# Patient Record
Sex: Male | Born: 1955 | Race: White | Hispanic: No | Marital: Married | Smoking: Never smoker
Health system: Southern US, Community
[De-identification: ages and names within clinical notes are randomized; demographics above are authoritative.]

## PROBLEM LIST (undated history)

## (undated) DIAGNOSIS — D759 Disease of blood and blood-forming organs, unspecified: Secondary | ICD-10-CM

## (undated) DIAGNOSIS — IMO0001 Reserved for inherently not codable concepts without codable children: Secondary | ICD-10-CM

## (undated) DIAGNOSIS — I739 Peripheral vascular disease, unspecified: Secondary | ICD-10-CM

## (undated) DIAGNOSIS — E785 Hyperlipidemia, unspecified: Secondary | ICD-10-CM

## (undated) DIAGNOSIS — Z8249 Family history of ischemic heart disease and other diseases of the circulatory system: Secondary | ICD-10-CM

## (undated) HISTORY — DX: Family history of ischemic heart disease and other diseases of the circulatory system: Z82.49

## (undated) HISTORY — DX: Peripheral vascular disease, unspecified: I73.9

## (undated) HISTORY — PX: COLONOSCOPY: SHX174

## (undated) HISTORY — DX: Hyperlipidemia, unspecified: E78.5

## (undated) HISTORY — DX: Reserved for inherently not codable concepts without codable children: IMO0001

---

## 1991-06-30 HISTORY — PX: HERNIA REPAIR: SHX51

## 2000-07-06 ENCOUNTER — Encounter: Payer: Self-pay | Admitting: Family Medicine

## 2000-07-06 ENCOUNTER — Encounter: Admission: RE | Admit: 2000-07-06 | Discharge: 2000-07-06 | Payer: Self-pay | Admitting: Family Medicine

## 2001-04-22 ENCOUNTER — Ambulatory Visit (HOSPITAL_COMMUNITY): Admission: RE | Admit: 2001-04-22 | Discharge: 2001-04-22 | Payer: Self-pay | Admitting: Gastroenterology

## 2001-04-22 ENCOUNTER — Encounter: Payer: Self-pay | Admitting: Gastroenterology

## 2003-02-19 ENCOUNTER — Encounter: Admission: RE | Admit: 2003-02-19 | Discharge: 2003-02-19 | Payer: Self-pay | Admitting: Family Medicine

## 2003-02-19 ENCOUNTER — Encounter: Payer: Self-pay | Admitting: Family Medicine

## 2004-06-29 HISTORY — PX: PIP JOINT FUSION: SHX2238

## 2005-03-04 ENCOUNTER — Ambulatory Visit (HOSPITAL_BASED_OUTPATIENT_CLINIC_OR_DEPARTMENT_OTHER): Admission: RE | Admit: 2005-03-04 | Discharge: 2005-03-05 | Payer: Self-pay | Admitting: *Deleted

## 2005-03-04 ENCOUNTER — Ambulatory Visit (HOSPITAL_COMMUNITY): Admission: RE | Admit: 2005-03-04 | Discharge: 2005-03-04 | Payer: Self-pay | Admitting: *Deleted

## 2006-06-29 HISTORY — PX: SHOULDER ARTHROSCOPY: SHX128

## 2006-06-29 HISTORY — PX: ORIF CALCANEOUS FRACTURE: SHX5030

## 2006-07-23 ENCOUNTER — Encounter: Admission: RE | Admit: 2006-07-23 | Discharge: 2006-07-23 | Payer: Self-pay | Admitting: Orthopaedic Surgery

## 2006-07-29 ENCOUNTER — Inpatient Hospital Stay (HOSPITAL_COMMUNITY): Admission: RE | Admit: 2006-07-29 | Discharge: 2006-07-30 | Payer: Self-pay | Admitting: Orthopedic Surgery

## 2006-10-20 ENCOUNTER — Encounter: Admission: RE | Admit: 2006-10-20 | Discharge: 2006-10-20 | Payer: Self-pay | Admitting: Orthopedic Surgery

## 2007-06-30 HISTORY — PX: FOOT ARTHRODESIS: SHX1655

## 2010-04-19 ENCOUNTER — Encounter: Admission: RE | Admit: 2010-04-19 | Discharge: 2010-04-19 | Payer: Self-pay | Admitting: *Deleted

## 2010-07-20 ENCOUNTER — Encounter: Payer: Self-pay | Admitting: Orthopedic Surgery

## 2010-09-23 ENCOUNTER — Other Ambulatory Visit: Payer: Self-pay | Admitting: Orthopedic Surgery

## 2010-09-23 DIAGNOSIS — M542 Cervicalgia: Secondary | ICD-10-CM

## 2010-09-29 ENCOUNTER — Ambulatory Visit
Admission: RE | Admit: 2010-09-29 | Discharge: 2010-09-29 | Disposition: A | Discharge status: Home / Self Care | Payer: BC Managed Care – PPO | Source: Ambulatory Visit | Attending: Orthopedic Surgery | Admitting: Orthopedic Surgery

## 2010-09-29 ENCOUNTER — Ambulatory Visit
Admission: RE | Admit: 2010-09-29 | Discharge: 2010-09-29 | Disposition: A | Discharge status: Home / Self Care | Payer: Self-pay | Source: Ambulatory Visit | Attending: Orthopedic Surgery | Admitting: Orthopedic Surgery

## 2010-09-29 DIAGNOSIS — M542 Cervicalgia: Secondary | ICD-10-CM

## 2010-11-14 NOTE — Op Note (Signed)
NAME:  SHOWN, [name]           ACCOUNT NO.:  [id number]   MEDICAL RECORD NO.:  [id number]          PATIENT TYPE:  INP   LOCATION:  2852                         FACILITY:  [facility]   PHYSICIAN:  Nadara Mustard, MD     DATE OF BIRTH:  Jan 13, 1956   DATE OF PROCEDURE:  07/28/2006  DATE OF DISCHARGE:                               OPERATIVE REPORT   PREOPERATIVE DIAGNOSIS:  Tongue tight left calcaneal fracture, Kyle Hansen  two-part.   POSTOPERATIVE DIAGNOSIS:  Tongue tight left calcaneal fracture, Kyle Hansen  two-part.   PROCEDURE:  Open reduction and internal fixation left calcaneus.   SURGEON:  Nadara Mustard, MD   ASSISTANT:  Vanita Panda. Magnus Ivan, M.D.   ANESTHESIA:  General.   ESTIMATED BLOOD LOSS:  Minimal.   ANTIBIOTICS:  1 gram of Kefzol.   DRAINS:  None.   COMPLICATIONS:  None.   TOURNIQUET TIME:  None.   DISPOSITION:  To the PACU in stable condition.   INDICATIONS FOR PROCEDURE:  The patient is a 55 year old gentleman  status post a fall sustaining a Sanders two-part with a tongue type  fracture of the posterior facet.  The patient, due to widening and non-  congruence of the posterior facet position, presents at this time for  internal fixation.  Risks and benefits were discussed including  infection, neurovascular injury, nonhealing of the wounds, arthritis,  need for additional surgery.  The patient states he understands and  wishes to proceed at this time.   DESCRIPTION OF PROCEDURE:  The patient was brought to OR room 1 and  underwent general anesthetic.  After an adequate level of anesthesia was  obtained, the patient was placed in the right lateral decubitus position  with the left side up and the left lower extremity was prepped using  DuraPrep and draped into a sterile field.  A percutaneous Shantz pin  with a hand chuck was placed through the os calcis tuberosity.  Using  distraction and a valgus stress, the varus impaction was decompressed.  Attention was then focused on the posterior facet.  Two pins were then  placed from the posterior aspect into the tongue type fracture of the  posterior facet. The posterior facet was elevated until this was aligned  to its normal articular alignment.  Then, a 2.5 drill bit was used and  three screws were placed superior and posteriorly through the tongue  type fragment and placed extended down into the inferior aspect of the  calcaneus to stabilize the posterior facet.  C-arm fluoroscopy was used  to verified reduction in both AP and lateral planes.  The patient still  had a mild residual varus but had anatomic restoration of the posterior  facet.  The wounds were irrigated with normal saline. The wounds were covered  Adaptic orthopedic sponges, Webril and a Coban dressing.  The patient  was extubated and taken to the PACU in stable condition.  The plan is  for 24 observation.  Plan to follow-up in the office in two weeks.      Nadara Mustard, MD  Electronically Signed     MVD/MEDQ  D:  07/28/2006  T:  07/28/2006  Job:  [id number]

## 2010-11-14 NOTE — Op Note (Signed)
NAME:  Kyle, Hansen           ACCOUNT NO.:  [id number]   MEDICAL RECORD NO.:  [id number]          PATIENT TYPE:  AMB   LOCATION:  DSC                          FACILITY:  [facility]   PHYSICIAN:  Tennis Must Meyerdierks, M.D.DATE OF BIRTH:  Jul 19, 1955   DATE OF PROCEDURE:  03/04/2005  DATE OF DISCHARGE:                                 OPERATIVE REPORT   PREOPERATIVE DIAGNOSIS:  Septic arthritis, PIP joint left long finger.   POSTOPERATIVE DIAGNOSIS:  Septic arthritis, PIP joint left long finger.   PROCEDURE:  Incision and drainage PIP joint, left long finger.   SURGEON:  Lowell Bouton, M.D.   ANESTHESIA:  General.   OPERATIVE FINDINGS:  The patient had a transverse laceration over the dorsum  of the PIP joint, that had grease that extended down into the joint. The  extensor tendon was 50% transected by the injury. There was grease tracking  along the extensor tendon, and actually imbedded into the head of the  proximal phalanx. There was gross purulent material.   PROCEDURE:  Under general anesthesia with a tourniquet on the left arm, the  left hand was prepped and draped in usual fashion.  After elevating the  limb, the tourniquet was inflated to 250 mmHg. Previous suture was removed  and the laceration was extended proximally. Blunt dissection was carried  down to the extensor tendon, and it had been partially transected. Gross  purulent material was present in the joint and cultures were obtained. A  rongeur was used to remove grease from the skin edges, the edges of the  tendon, and from the end of the proximal phalanx. A 20 cc syringe with a 23-  gauge needle was used to irrigate copiously the joint. Iodoform packing was  then placed through the rent and the tendon down to the joint, to allow for  drainage. The skin was closed loosely with 4-0 nylon sutures.  Sterile dressings were applied, followed by an Alumafoam splint with a  finger extended. The patient  tolerated the procedure well and went to the  recovery room; awake and stable, in good condition. Marcaine 0.50.% digital  block was inserted for pain control.      Lowell Bouton, M.D.  Electronically Signed     EMM/MEDQ  D:  03/04/2005  T:  03/04/2005  Job:  [id number]   cc:   Tammy R. Collins [place], M.D.  [address] [place]  [place] [address]  Fax: [phone]

## 2010-11-28 DIAGNOSIS — I779 Disorder of arteries and arterioles, unspecified: Secondary | ICD-10-CM

## 2010-11-28 HISTORY — DX: Disorder of arteries and arterioles, unspecified: I77.9

## 2010-12-17 DIAGNOSIS — IMO0001 Reserved for inherently not codable concepts without codable children: Secondary | ICD-10-CM

## 2010-12-17 HISTORY — DX: Reserved for inherently not codable concepts without codable children: IMO0001

## 2011-06-09 ENCOUNTER — Other Ambulatory Visit: Payer: Self-pay | Admitting: Orthopedic Surgery

## 2011-06-15 ENCOUNTER — Encounter (HOSPITAL_BASED_OUTPATIENT_CLINIC_OR_DEPARTMENT_OTHER): Payer: Self-pay | Admitting: *Deleted

## 2011-06-15 NOTE — H&P (Signed)
RE: Kyle Hansen. Kyle Hansen     Seen by: Katy Fitch Naaman Plummer., MD   OFFICE VISIT   04-15-11    DOB: 04-11-2056  Kyle Hansen is a well known former patient who presents for evaluation of 3 issues.  (1) Kyle Hansen has pain in Kyle Hansen right elbow over the olecranon. When Kyle Hansen leans on the elbow Kyle Hansen has sharp pain. Kyle Hansen has a small osteophyte at the olecranon and more likely than not is experiencing bursitis.  (2) On the left Kyle Hansen has pain in the anterior elbow, fullness in Kyle Hansen mobile wad muscles, and numbness when Kyle Hansen pronates and supinates Kyle Hansen forearm perceived in the thumb index finger. This is likely due to compression of the superficial branch of the radial nerve.   (3) Kyle Hansen also has a painful lump in Kyle Hansen right palm overlying the long finger MCP joint consistent with early Dupuytren's palmar fibromatosis.  Kyle Hansen's past medical history is reviewed in detail. Kyle Hansen is 5'8" and 170 lbs. Kyle Hansen pain is described as intermittent and moderate. Kyle Hansen has multiple drug allergies and a history of acute intermittent porphyria. Kyle Hansen has a list of drugs that are safe and unsafe. Kyle Hansen is under the care of Dr. Nanetta Batty cardiologist and is on Lipitor but does not know the dose. Kyle Hansen takes aspirin 81 mg daily. Prior surgery includes herniorrhaphy in 1993, left long finger surgery in 2005, left shoulder arthroscopy for impingement by Dr. Lajoyce Corners in 2008. Kyle Hansen social history reveals Kyle Hansen is married. Kyle Hansen is a tobacco chewer consuming 2 packs per week. Kyle Hansen does not use alcoholic beverages. Kyle Hansen family history is detailed and positive for coronary artery disease affecting Kyle Hansen father and brother. Kyle Hansen mother has hypertension and Kyle Hansen father has arthritis. A 14 system review of systems reveals corrective lenses, hearing impairment, chronic poor balance, depression and easy bruising.   Physical exam reveals a very pleasant 55 year old gentleman. Inspection of Kyle Hansen hands and arms reveals the aforementioned early Dupuytren's palmar fibromatosis affecting the  pretendinous fibers to the right long finger. Kyle Hansen has no sign of STS. Kyle Hansen has no flexion contracture of the MP or IP joints. Kyle Hansen has no significant involvement of the left hand other than a very minimal thickening of the pretendinous fibers of the palmar fascia to the left long finger. Kyle Hansen has full ROM of Kyle Hansen elbows, forearms, wrists and fingers. Kyle Hansen has a palpable osteophyte at Kyle Hansen right olecranon and tenderness in the bursa. Kyle Hansen has a palpable fullness in Kyle Hansen mobile wad muscles consistent with a probable pericapsular ganglion or myxoid cyst adjacent to the radial head. This likely is causing compression of the superficial radial nerve.  Assessment: (1) Right elbow bursitis. (2) Early Dupuytren's palmar fibromatosis. (3) Possible radiocapitellar ganglion or cyst leading to irritation of the radial superficial sensory nerve left elbow.  Plan: Kyle Hansen is referred for an MRI of Kyle Hansen left elbow at [facility]. Questions regarding Kyle Hansen symptoms and findings were invited and answered in detail.   RVS/phe T: 04-16-11      RE: Kyle Hansen     Seen by: Katy Fitch. Naaman Plummer., MD   OFFICE VISIT    05-04-11  DOB 04-11-2056  Kyle Hansen returns for follow up evaluation of Kyle Hansen right elbow olecranon bursitis, left elbow mass and Kyle Hansen Dupuytren's palmar fibromatosis. Kyle Hansen bursitis is improved. Kyle Hansen has no fluid but there is thickening of the bursa.   Kyle Hansen left elbow MRI is reviewed. 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 Kyle Hansen is  now experiencing posterior interosseous compression symptoms, particularly when Kyle Hansen flexes Kyle Hansen elbow and pronates. Kyle Hansen gets weakness in Kyle Hansen thumb and index finger. On isolated muscle testing of Kyle Hansen finger and thumb extensors Kyle Hansen is weak in Kyle Hansen long and index finger extrinsic extensors and is weak in the extensor pollicis longus.  I have advised Kyle Hansen to proceed with excision of Kyle Hansen radial neck ganglion at a mutually convenient time in November. We will schedule this at [facility] just before or after Thanksgiving. I pointed out Kyle Hansen is at some risk for transient posterior interosseous nerve palsy following dissection in this region. We will need to carefully decompress the posterior interosseous nerve.   With respect to Kyle Hansen right elbow we will also consider injection of the olecranon bursa at the time of Kyle Hansen left elbow myxoid cyst resection and posterior interosseous nerve decompression.  RVS/phe  T: 05-06-11

## 2011-06-15 NOTE — Progress Notes (Signed)
Brother dies mi this summer-had a workup with dr berry-called for notes No meds Has a rare hereditary blood disorder- Not had problems Has had surg here

## 2011-06-16 ENCOUNTER — Ambulatory Visit (HOSPITAL_BASED_OUTPATIENT_CLINIC_OR_DEPARTMENT_OTHER)
Admission: RE | Admit: 2011-06-16 | Discharge: 2011-06-16 | Disposition: A | Discharge status: Home / Self Care | Payer: BC Managed Care – PPO | Source: Ambulatory Visit | Attending: Orthopedic Surgery | Admitting: Orthopedic Surgery

## 2011-06-16 ENCOUNTER — Encounter (HOSPITAL_BASED_OUTPATIENT_CLINIC_OR_DEPARTMENT_OTHER): Payer: Self-pay | Admitting: Certified Registered"

## 2011-06-16 ENCOUNTER — Encounter (HOSPITAL_BASED_OUTPATIENT_CLINIC_OR_DEPARTMENT_OTHER)
Admission: RE | Disposition: A | Discharge status: Home / Self Care | Payer: Self-pay | Source: Ambulatory Visit | Attending: Orthopedic Surgery

## 2011-06-16 ENCOUNTER — Encounter (HOSPITAL_BASED_OUTPATIENT_CLINIC_OR_DEPARTMENT_OTHER): Payer: Self-pay | Admitting: Orthopedic Surgery

## 2011-06-16 ENCOUNTER — Ambulatory Visit (HOSPITAL_BASED_OUTPATIENT_CLINIC_OR_DEPARTMENT_OTHER): Payer: BC Managed Care – PPO | Admitting: Certified Registered"

## 2011-06-16 DIAGNOSIS — Z01812 Encounter for preprocedural laboratory examination: Secondary | ICD-10-CM | POA: Insufficient documentation

## 2011-06-16 DIAGNOSIS — M6740 Ganglion, unspecified site: Secondary | ICD-10-CM | POA: Insufficient documentation

## 2011-06-16 DIAGNOSIS — G5630 Lesion of radial nerve, unspecified upper limb: Secondary | ICD-10-CM | POA: Insufficient documentation

## 2011-06-16 HISTORY — PX: MASS EXCISION: SHX2000

## 2011-06-16 HISTORY — DX: Disease of blood and blood-forming organs, unspecified: D75.9

## 2011-06-16 HISTORY — DX: Unspecified porphyria: E80.20

## 2011-06-16 SURGERY — EXCISION MASS
Anesthesia: General | Laterality: Left | Wound class: Clean

## 2011-06-16 MED ORDER — ONDANSETRON HCL 4 MG/2ML IJ SOLN
INTRAMUSCULAR | Status: DC | PRN
  Administered 2011-06-16: 4 mg via INTRAVENOUS

## 2011-06-16 MED ORDER — CEFAZOLIN SODIUM 1-5 GM-% IV SOLN: 1.0000 g | Freq: Once | INTRAVENOUS | Status: DC

## 2011-06-16 MED ORDER — LIDOCAINE HCL (PF) 2 % IJ SOLN
INTRAMUSCULAR | Status: DC | PRN
  Administered 2011-06-16: 6 mL
  Administered 2011-06-16: 2 mL

## 2011-06-16 MED ORDER — OXYCODONE-ACETAMINOPHEN 5-325 MG PO TABS
1.0000 | ORAL_TABLET | Freq: Once | ORAL | Status: AC
  Administered 2011-06-16: 1 via ORAL

## 2011-06-16 MED ORDER — CEFAZOLIN SODIUM 1-5 GM-% IV SOLN
1.0000 g | Freq: Once | INTRAVENOUS | Status: AC
  Administered 2011-06-16: 1 g via INTRAVENOUS

## 2011-06-16 MED ORDER — DEXAMETHASONE SODIUM PHOSPHATE 4 MG/ML IJ SOLN
INTRAMUSCULAR | Status: DC | PRN
  Administered 2011-06-16: 10 mg via INTRAVENOUS

## 2011-06-16 MED ORDER — CHLORHEXIDINE GLUCONATE 4 % EX LIQD: 60.0000 mL | Freq: Once | CUTANEOUS | Status: DC

## 2011-06-16 MED ORDER — OXYCODONE-ACETAMINOPHEN 5-325 MG PO TABS: ORAL_TABLET | ORAL | Status: DC

## 2011-06-16 MED ORDER — PROPOFOL 10 MG/ML IV EMUL
INTRAVENOUS | Status: DC | PRN
  Administered 2011-06-16: 200 mg via INTRAVENOUS

## 2011-06-16 MED ORDER — SODIUM CHLORIDE 0.9 % IR SOLN
Status: DC | PRN
  Administered 2011-06-16: 100 mL

## 2011-06-16 MED ORDER — FENTANYL CITRATE 0.05 MG/ML IJ SOLN
25.0000 ug | INTRAMUSCULAR | Status: DC | PRN
  Administered 2011-06-16: 25 ug via INTRAVENOUS

## 2011-06-16 MED ORDER — LACTATED RINGERS IV SOLN
INTRAVENOUS | Status: DC
  Administered 2011-06-16 (×2): via INTRAVENOUS

## 2011-06-16 MED ORDER — FENTANYL CITRATE 0.05 MG/ML IJ SOLN
INTRAMUSCULAR | Status: DC | PRN
  Administered 2011-06-16: 100 ug via INTRAVENOUS

## 2011-06-16 SURGICAL SUPPLY — 62 items
BANDAGE ACE 4 STERILE (GAUZE/BANDAGES/DRESSINGS) IMPLANT
BANDAGE ADHESIVE 1X3 (GAUZE/BANDAGES/DRESSINGS) IMPLANT
BANDAGE ELASTIC 3 VELCRO ST LF (GAUZE/BANDAGES/DRESSINGS) IMPLANT
BLADE MINI RND TIP GREEN BEAV (BLADE) ×1 IMPLANT
BLADE SURG 15 STRL LF DISP TIS (BLADE) ×1 IMPLANT
BLADE SURG 15 STRL SS (BLADE) ×2
BNDG CMPR 9X4 STRL LF SNTH (GAUZE/BANDAGES/DRESSINGS)
BNDG CMPR MD 5X2 ELC HKLP STRL (GAUZE/BANDAGES/DRESSINGS)
BNDG COHESIVE 1X5 TAN STRL LF (GAUZE/BANDAGES/DRESSINGS) ×2 IMPLANT
BNDG ELASTIC 2 VLCR STRL LF (GAUZE/BANDAGES/DRESSINGS) IMPLANT
BNDG ESMARK 4X9 LF (GAUZE/BANDAGES/DRESSINGS) IMPLANT
BRUSH SCRUB EZ PLAIN DRY (MISCELLANEOUS) ×2 IMPLANT
CLIP TI MEDIUM 6 (CLIP) ×1 IMPLANT
CLOSURE STERI STRIP 1/2 X4 (GAUZE/BANDAGES/DRESSINGS) ×1 IMPLANT
CLOTH BEACON ORANGE TIMEOUT ST (SAFETY) ×2 IMPLANT
CORDS BIPOLAR (ELECTRODE) ×1 IMPLANT
COVER MAYO STAND STRL (DRAPES) ×2 IMPLANT
COVER TABLE BACK 60X90 (DRAPES) ×2 IMPLANT
CUFF TOURNIQUET SINGLE 18IN (TOURNIQUET CUFF) IMPLANT
CUFF TOURNIQUET SINGLE 24IN (TOURNIQUET CUFF) ×2 IMPLANT
DECANTER SPIKE VIAL GLASS SM (MISCELLANEOUS) IMPLANT
DRAIN PENROSE 1/2X12 LTX STRL (WOUND CARE) IMPLANT
DRAIN PENROSE 1/4X12 LTX STRL (WOUND CARE) IMPLANT
DRAPE EXTREMITY T 121X128X90 (DRAPE) ×2 IMPLANT
DRAPE SURG 17X23 STRL (DRAPES) ×2 IMPLANT
DRSG TEGADERM 4X4.75 (GAUZE/BANDAGES/DRESSINGS) ×2 IMPLANT
GAUZE XEROFORM 1X8 LF (GAUZE/BANDAGES/DRESSINGS) IMPLANT
GLOVE BIO SURGEON STRL SZ 6.5 (GLOVE) ×1 IMPLANT
GLOVE BIOGEL M STRL SZ7.5 (GLOVE) ×1 IMPLANT
GLOVE BIOGEL PI IND STRL 7.0 (GLOVE) IMPLANT
GLOVE BIOGEL PI IND STRL 8 (GLOVE) ×1 IMPLANT
GLOVE BIOGEL PI INDICATOR 7.0 (GLOVE) ×1
GLOVE BIOGEL PI INDICATOR 8 (GLOVE) ×1
GLOVE ORTHO TXT STRL SZ7.5 (GLOVE) ×2 IMPLANT
GOWN PREVENTION PLUS XLARGE (GOWN DISPOSABLE) ×2 IMPLANT
GOWN STRL REIN XL XLG (GOWN DISPOSABLE) ×4 IMPLANT
LOOP VESSEL MAXI BLUE (MISCELLANEOUS) ×1 IMPLANT
NDL BLUNT 17GA (NEEDLE) ×1 IMPLANT
NEEDLE 27GAX1X1/2 (NEEDLE) ×1 IMPLANT
NEEDLE BLUNT 17GA (NEEDLE) IMPLANT
PACK BASIN DAY SURGERY FS (CUSTOM PROCEDURE TRAY) ×2 IMPLANT
PAD CAST 3X4 CTTN HI CHSV (CAST SUPPLIES) IMPLANT
PADDING CAST COTTON 3X4 STRL (CAST SUPPLIES)
PADDING UNDERCAST 2  STERILE (CAST SUPPLIES) IMPLANT
SPLINT PLASTER CAST XFAST 3X15 (CAST SUPPLIES) IMPLANT
SPLINT PLASTER XTRA FASTSET 3X (CAST SUPPLIES)
SPONGE GAUZE 4X4 12PLY (GAUZE/BANDAGES/DRESSINGS) ×1 IMPLANT
STOCKINETTE 4X48 STRL (DRAPES) ×2 IMPLANT
STRIP CLOSURE SKIN 1/2X4 (GAUZE/BANDAGES/DRESSINGS) ×1 IMPLANT
SUT ETHILON 5 0 P 3 18 (SUTURE) ×1
SUT MERSILENE 4 0 P 3 (SUTURE) IMPLANT
SUT NYLON ETHILON 5-0 P-3 1X18 (SUTURE) ×1 IMPLANT
SUT PROLENE 3 0 PS 2 (SUTURE) IMPLANT
SUT VIC AB 4-0 P-3 18XBRD (SUTURE) IMPLANT
SUT VIC AB 4-0 P3 18 (SUTURE) ×4
SYR 20CC LL (SYRINGE) ×1 IMPLANT
SYR 3ML 23GX1 SAFETY (SYRINGE) IMPLANT
SYR CONTROL 10ML LL (SYRINGE) ×1 IMPLANT
TOWEL OR 17X24 6PK STRL BLUE (TOWEL DISPOSABLE) ×3 IMPLANT
TRAY DSU PREP LF (CUSTOM PROCEDURE TRAY) ×2 IMPLANT
UNDERPAD 30X30 INCONTINENT (UNDERPADS AND DIAPERS) ×2 IMPLANT
WATER STERILE IRR 1000ML POUR (IV SOLUTION) ×1 IMPLANT

## 2011-06-16 NOTE — Op Note (Signed)
Op note dictated:  06/16/11 [id number]

## 2011-06-16 NOTE — Interval H&P Note (Signed)
History and Physical Interval Note:  06/16/2011 10:49 AM  Kyle Hansen  has presented today for surgery, with the diagnosis of left radial neck ganglion, post interosseus syndrome  The various methods of treatment have been discussed with the patient and family. After consideration of risks, benefits and other options for treatment, the patient has consented to  Procedure(s): EXCISION MASS as a surgical intervention .  The patients' history has been reviewed, patient examined, no change in status, stable for surgery.  I have reviewed the patients' chart and labs.  Questions were answered to the patient's satisfaction.     Ozie Dimaria JR,Juniel Groene V  H&P documentation: 06/16/2011  -History and Physical Reviewed  -Patient has been re-examined  -No change in the plan of care  Wyn Forster, MD

## 2011-06-16 NOTE — H&P (Signed)
  Kyle Hansen is an 55 y.o. male.   Chief Complaint: c/o painful left elbow with a mass . (1) HPI: He has pain in his right elbow over the olecranon. When he leans on the elbow he has sharp pain. He has a small osteophyte at the olecranon and more likely than not is experiencing bursitis.  (2) On the left he has pain in the anterior elbow, fullness in his mobile wad muscles, and numbness when he pronates and supinates his forearm perceived in the thumb index finger. This is likely due to compression of the superficial branch of the radial nerve.    Past Medical History  Diagnosis Date  . Porphyria     multiple drugs to avoidsome meds are safe  . Blood dyscrasia   . No pertinent past medical history     Past Surgical History  Procedure Date  . Shoulder arthroscopy 2008    lt  . Hernia repair 1993  . Foot arthrodesis 2009    lt  . Pip joint fusion 2006    lt  . Orif calcaneous fracture 2008    lt  . Colonoscopy     History reviewed. No pertinent family history. Social History:  reports that he has never smoked. He does not have any smokeless tobacco history on file. He reports that he does not drink alcohol or use illicit drugs.  Allergies: No Known Allergies  No current facility-administered medications on file as of .   Medications Prior to Admission  Medication Sig Dispense Refill  . aspirin 81 MG tablet Take 81 mg by mouth daily.        . fish oil-omega-3 fatty acids 1000 MG capsule Take 2 g by mouth daily.        . meloxicam (MOBIC) 15 MG tablet Take 15 mg by mouth daily.        . Multiple Vitamin (MULTIVITAMIN) capsule Take 1 capsule by mouth daily.          No results found for this or any previous visit (from the past 48 hour(s)).  No results found.   Pertinent items are noted in HPI.  Height 5\' 8"  (1.727 m), weight 79.379 kg (175 lb).  General appearance: alert Head: Normocephalic, without obvious abnormality Neck: supple, symmetrical, trachea  midline Resp: clear to auscultation bilaterally Cardio: regular rate and rhythm, S1, S2 normal, no murmur, click, rub or gallop GI: normal findings: bowel sounds normal Extremities: . He has a palpable fullness in his mobile wad muscles consistent with a probable pericapsular ganglion or myxoid cyst adjacent to the radial head. This likely is causing compression of the superficial radial nerve. 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   DASNOIT,Teja Costen J 06/16/2011, 8:28 AM   H&P documentation: 06/16/2011  -History and Physical Reviewed  -Patient has been re-examined  -No change in the plan of care  Wyn Forster, MD

## 2011-06-16 NOTE — Anesthesia Procedure Notes (Signed)
Procedure Name: LMA Insertion Date/Time: 06/16/2011 11:02 AM Performed by: Radford Pax Pre-anesthesia Checklist: Patient identified, Emergency Drugs available, Suction available, Patient being monitored and Timeout performed Patient Re-evaluated:Patient Re-evaluated prior to inductionOxygen Delivery Method: Circle System Utilized Preoxygenation: Pre-oxygenation with 100% oxygen Intubation Type: IV induction Ventilation: Mask ventilation without difficulty LMA: LMA inserted LMA Size: 4.0 Number of attempts: 1 (surgilube used) Airway Equipment and Method: bite block (Bite Gard on left) Placement Confirmation: positive ETCO2 and breath sounds checked- equal and bilateral Tube secured with: Tape (plastic tape used) Dental Injury: Teeth and Oropharynx as per pre-operative assessment

## 2011-06-16 NOTE — Brief Op Note (Signed)
06/16/2011  12:21 PM  PATIENT:  Kyle Hansen  55 y.o. male  PRE-OPERATIVE DIAGNOSIS:  left radial neck ganglion, post interosseus syndrome  POST-OPERATIVE DIAGNOSIS:  left radial neck ganglion, post interosseus syndrome  PROCEDURE:  Procedure(s): EXCISION MASS (MYXOID CYST) LEFT ELBOW RADIAL NECK, FORMAL NEUROLYSIS OF RADIAL, AND POSTERIOR INTEROSSEUS NERVE BRANCHES  SURGEON:  Surgeon(s): Wyn Forster., MD  PHYSICIAN ASSISTANT:   ASSISTANTS: Mallory Shirk.A-C   ANESTHESIA:   general  EBL:  Total I/O In: 100 [I.V.:100] Out: -   BLOOD ADMINISTERED:none  DRAINS: none   LOCAL MEDICATIONS USED:  LIDOCAINE 4 CC  SPECIMEN:  No Specimen  DISPOSITION OF SPECIMEN:  N/A  COUNTS:  YES  TOURNIQUET:   Total Tourniquet Time Documented: Upper Arm (Left) - 50 minutes  DICTATION: .Other Dictation: Dictation Number [id number]  PLAN OF CARE: Discharge to home after PACU  PATIENT DISPOSITION:  PACU - hemodynamically stable.

## 2011-06-16 NOTE — Transfer of Care (Signed)
Immediate Anesthesia Transfer of Care Note  Patient: Kyle Hansen  Procedure(s) Performed:  EXCISION MASS - left elbow, and posterior interosseus nerve decompression with injection of right olecranon bursa  Patient Location: PACU  Anesthesia Type: General  Level of Consciousness: awake, alert , oriented, sedated and patient cooperative  Airway & Oxygen Therapy: Patient Spontanous Breathing and Patient connected to face mask oxygen  Post-op Assessment: Report given to PACU RN and Post -op Vital signs reviewed and stable  Post vital signs: Reviewed and stable  Complications: No apparent anesthesia complications

## 2011-06-16 NOTE — Anesthesia Postprocedure Evaluation (Signed)
  Anesthesia Post-op Note  Patient: Kyle Hansen  Procedure(s) Performed:  EXCISION MASS - left elbow, and posterior interosseus nerve decompression with injection of right olecranon bursa  Patient Location: PACU  Anesthesia Type: General  Level of Consciousness: awake, alert  and oriented  Airway and Oxygen Therapy: Patient Spontanous Breathing  Post-op Pain: none  Post-op Assessment: Post-op Vital signs reviewed, Patient's Cardiovascular Status Stable, Respiratory Function Stable, Patent Airway, No signs of Nausea or vomiting and Pain level controlled  Post-op Vital Signs: Reviewed and stable  Complications: No apparent anesthesia complications

## 2011-06-16 NOTE — Anesthesia Preprocedure Evaluation (Signed)
Anesthesia Evaluation  Patient identified by MRN, date of birth, ID band Patient awake    Reviewed: Allergy & Precautions, H&P , NPO status , Patient's Chart, lab work & pertinent test results  History of Anesthesia Complications Negative for: history of anesthetic complications  Airway Mallampati: I TM Distance: >3 FB Neck ROM: Full    Dental No notable dental hx. 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, hospitalized at [facility]   Anesthesia Other Findings   Reproductive/Obstetrics                           Anesthesia Physical Anesthesia Plan  ASA: III  Anesthesia Plan: General   Post-op Pain Management:    Induction: Intravenous  Airway Management Planned: LMA  Additional Equipment:   Intra-op Plan:   Post-operative Plan:   Informed Consent: I have reviewed the patients History and Physical, chart, labs and discussed the procedure including the risks, benefits and alternatives for the proposed anesthesia with the patient or authorized representative who has indicated his/her understanding and acceptance.   Dental advisory given  Plan Discussed with: CRNA and Surgeon  Anesthesia Plan Comments: (Plan routine monitors, GA- LMA OK)        Anesthesia Quick Evaluation

## 2011-06-17 NOTE — Op Note (Signed)
NAME:  Donaghue, Khoury                ACCOUNT NO.:  MEDICAL RECORD NO.:  [id number]  LOCATION:                                 FACILITY:  PHYSICIAN:  Katy Fitch. Cutberto Winfree, M.D.      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  OPERATIONS: 1. Resection of radial neck ganglion through anterior extensile Sherilyn Cooter     approach. 2. Formal neurolysis of radial nerve, radial superficial sensory     branch, and posterior interosseous nerve branches.  OPERATING SURGEON:  Katy Fitch. Elisa Kutner, MD  ASSISTANT:  Marveen Reeks Dasnoit, PA-C  ANESTHESIA:  General by LMA.  SUPERVISING ANESTHESIOLOGIST:  Germaine Pomfret, MD  INDICATIONS:  Joshiah Traynham is a 55 year old gentleman referred for evaluation and management of a weak and painful left elbow and arm. Primary care physician is Tammy R. Collins Scotland, MD, of [place], [place].  He had noted discomfort at the elbow and weakness of his thumb and index fingers in extension and pain with pronation of his forearm.  Plain x-rays were unrevealing.  Clinical examination suggested a vague fullness in the proximal forearm.  We referred him for an MRI which documented a sizeable myxoid cyst measuring perhaps 3.5 x 2.5 cm at the radial neck.  This was clearly causing compression the posterior interosseous nerve fibers against the arcade of [place].  Preoperatively, we had a detailed  informed consent with Mr. Hebert. We pointed out that myxoid cysts can be very challenging to eradicate. Our goal will be to dissect the cyst, remove its myxoid contents, curette the wall of the cyst, and try to prevent recurrence.  It is very challenging to identify the origin of the cyst at the elbow.  We also recommended formal neurolysis of the radial nerve posterior interosseous branches and superficial sensory branch of the radial nerve through an anterior extensile Henry approach at the safest way to address this region.  He understands that he is at some risks to develop another cyst at a later date.  Should this occur, there may be benefit in a formal elbow evaluation with arthroscopy and a posterior approach to the elbow to address the annular ligament.  Questions regarding the anticipated surgery were invited and answered in detail at this time.  PROCEDURE:  Youssef Footman was brought to room #6 of the [facility] and placed in supine position upon the operating table. All members of the operative team were informed as to his background medical problem of porphyria which placed him at risk for negative consequences of exposed to multiple classes of drugs.  We reviewed the entire literature available as to the drugs that should be avoided.  He was noted to  have multiple intolerances on the chart, but no frank allergies.  He was not allergic to antibiotics.  Under Dr. Edison Pace direct supervision, general anesthesia by LMA technique was induced followed by routine Betadine scrub and paint of the left upper extremity.  A pneumatic tourniquet was applied to the proximal left brachium.  Following exsanguination of the left arm with Esmarch bandage, the arterial tourniquet was inflated to 220 mmHg.  Procedure commenced with a routine surgical time-out.  An anterior extensile Sherilyn Cooter approach was planned beginning 2 cm above the elbow flexion crease  extending 5 cm distally.  The skin incision was taken sharply followed by identification of subcutaneous vessels which were electrocauterized with bipolar forceps. The lateral antebrachial cutaneous nerve was identified, retracted in a radial direction.  The fascia overlying the brachioradialis was incised and various recurrent vessel leashes were identified ultimately mobilizing the brachioradialis and extensor carpi radialis longus and brevis off the radial nerve, radial superficial sensory branch and ultimately identifying the branch point of the posterior interosseous nerve branches.  The arcade of [place]se was dissected.  There was a complex plexus of veins obscuring the posterior interosseous nerve.  These were sequentially taken down with a combination of suture ligation and use of Ligaclips. Ultimately, the branches of the posterior interosseous nerve were carefully retracted with parotid retractors followed by identification of the mass that was dissecting through the fibers of the supinator muscle.  The supinator muscle was spread in line of its fibers and the arcade of [place] was resected with scissors over distance of 2 cm distally amply decompressing the posterior interosseous nerve branches.  Once posterior interosseous nerve branches were safely retracted, the cyst was identified.  The anterior aspect of its wall was resected.  It was spread open and at least 5 cc of myxoid material was extracted.  This was removed by irrigation followed by use of a standard size curette to thoroughly curette the annular ligament and the neck and periosteum of the radius.  The exact origin of this mass was challenging to identify through an anterior approach.  There is a small chance that there will be recurrence.  We thoroughly curetted the wall of the cyst followed by obtaining hemostasis.  The tourniquet was released and bleeding was controlled by bipolar forceps.  The wound was  then repaired with subcutaneous suture of 4-0 Vicryl and intradermal 3-0 Prolene with Steri-Strips.  Mr. Vandevoorde was placed in a Tegaderm dressing with sterile gauze directly over Steri-Strips followed by an Ace wrap.  He had requested preoperatively to have an injection of steroid into his right olecranon bursa.  After Betadine prep of the skin, a mixture of 1.5 mL of 2% plain lidocaine and 40 mg of Depo-Medrol was directly injected into the right olecranon bursa from the distal approach distending the bursa.  The wound was then cleaned with Betadine and dressed with a Band- Aid.  There were no apparent complications.  For aftercare, Mr. Sundby is provided prescriptions for Percocet 5 mg 1 p.o. q.4-6 hours p.r.n. pain, #30 tablets without refill.  We will see him back for followup in our office in 1 week or sooner p.r.n. problems.     Katy Fitch Lihanna Biever, M.D.     RVS/MEDQ  D:  06/16/2011  T:  06/17/2011  Job:  [id number]  cc:   Tammy R. Collins Scotland, M.D.

## 2011-06-19 ENCOUNTER — Encounter (HOSPITAL_BASED_OUTPATIENT_CLINIC_OR_DEPARTMENT_OTHER): Payer: Self-pay | Admitting: Orthopedic Surgery

## 2012-12-28 ENCOUNTER — Other Ambulatory Visit: Payer: Self-pay

## 2012-12-28 MED ORDER — PRAVASTATIN SODIUM 20 MG PO TABS: 20.0000 mg | ORAL_TABLET | Freq: Every evening | ORAL | Status: DC

## 2012-12-28 NOTE — Telephone Encounter (Signed)
Rx was sent to pharmacy electronically. 

## 2012-12-29 ENCOUNTER — Other Ambulatory Visit: Payer: Self-pay | Admitting: *Deleted

## 2013-01-03 ENCOUNTER — Other Ambulatory Visit: Payer: Self-pay

## 2013-01-03 MED ORDER — PRAVASTATIN SODIUM 20 MG PO TABS: 20.0000 mg | ORAL_TABLET | Freq: Every evening | ORAL | Status: DC

## 2013-01-03 NOTE — Telephone Encounter (Signed)
Rx was sent to pharmacy electronically. 

## 2013-01-05 ENCOUNTER — Telehealth: Payer: Self-pay | Admitting: Cardiovascular Disease

## 2013-01-05 NOTE — Telephone Encounter (Signed)
Kyle Hansen is wanting to know will the generic pravastatin be ok for the patient.. This is what the patient has been taking the whole time.

## 2013-01-05 NOTE — Telephone Encounter (Signed)
Fax received and on Dr. Hazle Coca cart for review.

## 2013-01-06 ENCOUNTER — Telehealth: Payer: Self-pay | Admitting: Cardiovascular Disease

## 2013-01-06 NOTE — Telephone Encounter (Signed)
Returned call.  On hold x 5 mins and no answer.  Call ended.  Request faxed back this morning and again when call received.

## 2013-01-06 NOTE — Telephone Encounter (Signed)
Kyle Hansen is calling in reference to Pravachol 20mg  .. Since  generic cost less than the brand ..( Pravastatin) wants a permission to change it to the generic form...   Thanks

## 2013-01-23 ENCOUNTER — Encounter: Payer: Self-pay | Admitting: Cardiology

## 2013-01-23 DIAGNOSIS — I779 Disorder of arteries and arterioles, unspecified: Secondary | ICD-10-CM

## 2013-01-23 DIAGNOSIS — Z8249 Family history of ischemic heart disease and other diseases of the circulatory system: Secondary | ICD-10-CM

## 2013-01-23 DIAGNOSIS — E785 Hyperlipidemia, unspecified: Secondary | ICD-10-CM

## 2013-01-25 ENCOUNTER — Encounter: Payer: Self-pay | Admitting: Cardiovascular Disease

## 2013-01-26 ENCOUNTER — Ambulatory Visit: Payer: BC Managed Care – PPO | Admitting: Cardiovascular Disease

## 2013-03-28 ENCOUNTER — Other Ambulatory Visit: Payer: Self-pay | Admitting: Nurse Practitioner

## 2013-03-28 DIAGNOSIS — G4730 Sleep apnea, unspecified: Secondary | ICD-10-CM

## 2013-03-30 ENCOUNTER — Ambulatory Visit
Admission: RE | Admit: 2013-03-30 | Discharge: 2013-03-30 | Disposition: A | Discharge status: Home / Self Care | Payer: BC Managed Care – PPO | Source: Ambulatory Visit | Attending: Nurse Practitioner | Admitting: Nurse Practitioner

## 2013-03-30 DIAGNOSIS — G4730 Sleep apnea, unspecified: Secondary | ICD-10-CM

## 2013-03-30 MED ORDER — IOHEXOL 300 MG/ML  SOLN
75.0000 mL | Freq: Once | INTRAMUSCULAR | Status: AC | PRN
  Administered 2013-03-30: 75 mL via INTRAVENOUS

## 2013-04-04 ENCOUNTER — Ambulatory Visit (INDEPENDENT_AMBULATORY_CARE_PROVIDER_SITE_OTHER): Payer: BC Managed Care – PPO | Admitting: Cardiovascular Disease

## 2013-04-04 ENCOUNTER — Encounter: Payer: Self-pay | Admitting: Cardiovascular Disease

## 2013-04-04 VITALS — BP 156/80 | HR 46 | Ht 68.0 in | Wt 170.6 lb

## 2013-04-04 DIAGNOSIS — Z8249 Family history of ischemic heart disease and other diseases of the circulatory system: Secondary | ICD-10-CM

## 2013-04-04 DIAGNOSIS — E785 Hyperlipidemia, unspecified: Secondary | ICD-10-CM

## 2013-04-04 NOTE — Assessment & Plan Note (Signed)
Patient is currently asymptomatic. He had a Myoview stress test performed 12/17/10 which was normal

## 2013-04-04 NOTE — Assessment & Plan Note (Signed)
On statin therapy followed by his PCP 

## 2013-04-04 NOTE — Patient Instructions (Addendum)
Your physician recommends that you schedule a follow-up appointment in: 1 year  

## 2013-04-04 NOTE — Progress Notes (Signed)
04/04/2013 Arby Barrette Holben   1955-08-23  [id number]  Primary Physician Herb Grays, MD Primary Cardiologist: Runell Gess MD Roseanne Reno   HPI:  The patient is a 57 year old, thin-appearing, married Caucasian male, father of 2, grandfather to 2 grandchildren who I saw a year ago. He is referred to me because of positive risk factors. His wife Zella Ball is also a patient of mine. He has a history of hyperlipidemia as well as a strong family history for heart disease with a father that had his first MI at 28. His brother had his first MI at age 94 and bypass grafting at 49. He has never had a heart attack or stroke. He denies chest pain or shortness of breath. He had a Myoview stress test performed a year ago in our office which is normal and carotid Dopplers that showed no evidence of ICA stenosis.Dr. Collins Scotland check a lipid profile in March of this year. Since I saw him a year ago he denies chest pain or shortness of breath.    Current Outpatient Prescriptions  Medication Sig Dispense Refill  . aspirin 81 MG tablet Take 81 mg by mouth daily.        . Coenzyme Q10 (COQ10) 200 MG CAPS Take by mouth.      . fish oil-omega-3 fatty acids 1000 MG capsule Take 2 g by mouth daily.        . Multiple Vitamin (MULTIVITAMIN) capsule Take 1 capsule by mouth daily.        . pravastatin (PRAVACHOL) 20 MG tablet Take 1 tablet (20 mg total) by mouth every evening.  90 tablet  0  . vitamin B-12 (CYANOCOBALAMIN) 100 MCG tablet Take 50 mcg by mouth daily.      . pantoprazole (PROTONIX) 40 MG tablet Take 1 tablet by mouth daily.      . traMADol (ULTRAM) 50 MG tablet Take 1 tablet by mouth daily.       No current facility-administered medications for this visit.    Allergies  Allergen Reactions  . Barbiturates Other (See Comments)    Can cause activity of porphyria  . Chloramphenicols Other (See Comments)    Can induce porphyria  . Ergot Alkaloids Other (See Comments)    Can induce  porphyria  . Griseofulvin Other (See Comments)    Can induce porphyria  . Librium Other (See Comments)    Can induce porphyria  . Methsuximide Other (See Comments)    Can induce porphyria  . [place] [Meprobamate] Other (See Comments)    Can induce porphyria  . Orinase [Tolbutamide] Other (See Comments)    Can induce porphyria  . Sulfonamide Derivatives Other (See Comments)    Can induce porphyria  . Tofranil-Pm Other (See Comments)    Can induce porphyria  . Phenytoin Sodium Extended Other (See Comments)    Can induce porphyria    History   Social History  . Marital Status: Married    Spouse Name: N/A    Number of Children: N/A  . Years of Education: N/A   Occupational History  . Not on file.   Social History Main Topics  . Smoking status: Never Smoker   . Smokeless tobacco: Current User  . Alcohol Use: No  . Drug Use: No  . Sexual Activity: Not on file   Other Topics Concern  . Not on file   Social History Narrative  . No narrative on file     Review of Systems: General: negative  for chills, fever, night sweats or weight changes.  Cardiovascular: negative for chest pain, dyspnea on exertion, edema, orthopnea, palpitations, paroxysmal nocturnal dyspnea or shortness of breath Dermatological: negative for rash Respiratory: negative for cough or wheezing Urologic: negative for hematuria Abdominal: negative for nausea, vomiting, diarrhea, bright red blood per rectum, melena, or hematemesis Neurologic: negative for visual changes, syncope, or dizziness All other systems reviewed and are otherwise negative except as noted above.    Blood pressure 156/80, pulse 46, height 5\' 8"  (1.727 m), weight 170 lb 9.6 oz (77.384 kg).  General appearance: alert and no distress Neck: no adenopathy, no carotid bruit, no JVD, supple, symmetrical, trachea midline and thyroid not enlarged, symmetric, no tenderness/mass/nodules Lungs: clear to auscultation bilaterally Heart: regular  rate and rhythm, S1, S2 normal, no murmur, click, rub or gallop Extremities: extremities normal, atraumatic, no cyanosis or edema  EKG sinus bradycardia at 46 without ST or T wave changes  ASSESSMENT AND PLAN:   Hyperlipidemia LDL goal < 70 On statin therapy followed by his PCP  Family history of premature CAD Patient is currently asymptomatic. He had a Myoview stress test performed 12/17/10 which was normal      Runell Gess MD [facility], [facility] 04/04/2013 9:00 AM

## 2013-04-05 ENCOUNTER — Encounter: Payer: Self-pay | Admitting: Cardiovascular Disease

## 2013-04-06 ENCOUNTER — Ambulatory Visit (INDEPENDENT_AMBULATORY_CARE_PROVIDER_SITE_OTHER): Payer: BC Managed Care – PPO | Admitting: Internal Medicine

## 2013-04-06 ENCOUNTER — Encounter: Payer: Self-pay | Admitting: Internal Medicine

## 2013-04-06 VITALS — BP 136/76 | HR 47 | Temp 98.9°F | Ht 68.0 in | Wt 172.0 lb

## 2013-04-06 DIAGNOSIS — R05 Cough: Secondary | ICD-10-CM

## 2013-04-06 DIAGNOSIS — R059 Cough, unspecified: Secondary | ICD-10-CM

## 2013-04-06 NOTE — Progress Notes (Signed)
  Subjective:    Patient ID: Kyle Hansen, male    DOB: 24-Sep-1955 MRN: [id number]  HPI  89 yowm never smoker with no problem ever with activity tolerance but problems in spring and fall with post nasal drainage resp to otc since around  2007  referred 04/06/2013 to pulmonary clinic for noct spells of choking by Dr Allison Quarry at the [facility].  04/06/2013 1st [facility] office visit/ [facility] [place] cc acute onset of breathing problems while sitting 45 degrees  in dentist chair  having a root canal molar R rear with sense of choking x few secs in May 2014 then did ok for a while  then started back late Aug/early sept with sense of choking on what was previously " nl fall drainage" typically wakes him up 1-3 am with slt yellow mucus production better on amox x 2 d prior to OV  - other that these spells denies sob eg with activity including heavy yardwork.  No obvious day to day or daytime variabilty   or cp or chest tightness, subjective wheeze overt hb symptoms. No unusual exp hx or h/o childhood pna/ asthma or knowledge of premature birth.  Sleeping ok without nocturnal  or early am exacerbation  of respiratory  c/o's or need for noct saba. Also denies any obvious fluctuation of symptoms with weather or environmental changes or other aggravating or alleviating factors except as outlined above   Current Medications, Allergies, Complete Past Medical History, Past Surgical History, Family History, and Social History were reviewed in [employer] record.           Review of Systems  Constitutional: Negative for fever, chills, activity change, appetite change and unexpected weight change.  HENT: Positive for dental problem and sneezing. Negative for congestion, postnasal drip, rhinorrhea, sore throat, trouble swallowing and voice change.   Eyes: Negative for visual disturbance.  Respiratory: Positive for cough and shortness of breath. Negative for choking.   Cardiovascular:  Negative for chest pain and leg swelling.  Gastrointestinal: Negative for nausea, vomiting and abdominal pain.  Genitourinary: Negative for difficulty urinating.  Musculoskeletal: Positive for arthralgias.  Skin: Negative for rash.  Psychiatric/Behavioral: Negative for behavioral problems and confusion.       Objective:   Physical Exam  Wt Readings from Last 3 Encounters:  04/06/13 172 lb (78.019 kg)  04/04/13 170 lb 9.6 oz (77.384 kg)  06/15/11 175 lb (79.379 kg)     HEENT: nl dentition, turbinates, and orophanx. Nl external ear canals without cough reflex   NECK :  without JVD/Nodes/TM/ nl carotid upstrokes bilaterally   LUNGS: no acc muscle use, clear to A and P bilaterally without cough on insp or exp maneuvers   CV:  RRR  no s3 or murmur or increase in P2, no edema   ABD:  soft and nontender with nl excursion in the supine position. No bruits or organomegaly, bowel sounds nl  MS:  warm without deformities, calf tenderness, cyanosis or clubbing  SKIN: warm and dry without lesions    NEURO:  alert, approp, no deficits    03/30/13  reviewed Unremarkable CT neck except for cervical spondylosis. No pharyngeal,  laryngeal, or tracheal lesions are observed.      Assessment & Plan:

## 2013-04-06 NOTE — Patient Instructions (Signed)
Finish the amoxicillin Pantoprazole (protonix) 40 mg   Take 30-60 min before first meal of the day and Pepcid 20 mg one bedtime  X one month automatically   GERD (REFLUX)  is an extremely common cause of respiratory symptoms, many times with no significant heartburn at all.    It can be treated with medication, but also with lifestyle changes including avoidance of late meals, excessive alcohol, smoking cessation, and avoid fatty foods, chocolate, peppermint, colas, red wine, and acidic juices such as orange juice.  NO MINT OR MENTHOL PRODUCTS SO NO COUGH DROPS  USE SUGARLESS CANDY INSTEAD (jolley ranchers or Stover's)  NO OIL BASED VITAMINS - use powdered substitutes.  If not improving on this combination you need a sinus ct call [facility] [phone]

## 2013-04-08 DIAGNOSIS — R05 Cough: Secondary | ICD-10-CM | POA: Insufficient documentation

## 2013-04-08 DIAGNOSIS — R059 Cough, unspecified: Secondary | ICD-10-CM | POA: Insufficient documentation

## 2013-04-08 NOTE — Assessment & Plan Note (Signed)
The most common causes of chronic cough in immunocompetent adults include the following: upper airway cough syndrome (UACS), previously referred to as postnasal drip syndrome (PNDS), which is caused by variety of rhinosinus conditions; (2) asthma; (3) GERD; (4) chronic bronchitis from cigarette smoking or other inhaled environmental irritants; (5) nonasthmatic eosinophilic bronchitis; and (6) bronchiectasis.   These conditions, singly or in combination, have accounted for up to 94% of the causes of chronic cough in prospective studies.   Other conditions have constituted no >6% of the causes in prospective studies These have included bronchogenic carcinoma, chronic interstitial pneumonia, sarcoidosis, left ventricular failure, ACEI-induced cough, and aspiration from a condition associated with pharyngeal dysfunction.    Chronic cough is often simultaneously caused by more than one condition. A single cause has been found from 38 to 82% of the time, multiple causes from 18 to 62%. Multiply caused cough has been the result of three diseases up to 42% of the time.      This is almost certainly an example of  Classic Upper airway cough syndrome, so named because it's frequently impossible to sort out how much is  CR/sinusitis with freq throat clearing (which can be related to primary GERD)   vs  causing  secondary (" extra esophageal")  GERD from wide swings in gastric pressure that occur with throat clearing, often  promoting self use of mint and menthol lozenges that reduce the lower esophageal sphincter tone and exacerbate the problem further in a cyclical fashion.   These are the same pts (now being labeled as having "irritable larynx syndrome" by some cough centers) who not infrequently have a history of having failed to tolerate ace inhibitors,  dry powder inhalers or biphosphonates or report having atypical reflux symptoms that don't respond to standard doses of PPI , and are easily confused as having  aecopd or asthma flares by even experienced allergists/ pulmonologists.   Most likely he never completely recovered from the throat irritation from gagging at the dentist office then worsened with onset of his usual pnds in fall > ? Underlying sinusitis as well > agree with rx with amox and f/u sinus ct if not completely better but also add max gerd rx until this resolves, esp pepcid at hs which is very effective in noct gerd.

## 2013-04-14 ENCOUNTER — Other Ambulatory Visit: Payer: Self-pay | Admitting: Cardiovascular Disease

## 2013-04-17 NOTE — Telephone Encounter (Signed)
Rx was sent to pharmacy electronically. 

## 2013-05-02 ENCOUNTER — Telehealth: Payer: Self-pay | Admitting: Internal Medicine

## 2013-05-02 DIAGNOSIS — R059 Cough, unspecified: Secondary | ICD-10-CM

## 2013-05-02 DIAGNOSIS — R05 Cough: Secondary | ICD-10-CM

## 2013-05-02 NOTE — Telephone Encounter (Signed)
I spoke with pt. He reports he is not feeling better so he was calling to get the sinus CT scan done. I advised pt will place order.  Patient Instructions    Finish the amoxicillin Pantoprazole (protonix) 40 mg   Take 30-60 min before first meal of the day and Pepcid 20 mg one bedtime  X one month automatically   GERD (REFLUX)  is an extremely common cause of respiratory symptoms, many times with no significant heartburn at all.    It can be treated with medication, but also with lifestyle changes including avoidance of late meals, excessive alcohol, smoking cessation, and avoid fatty foods, chocolate, peppermint, colas, red wine, and acidic juices such as orange juice.   NO MINT OR MENTHOL PRODUCTS SO NO COUGH DROPS  USE SUGARLESS CANDY INSTEAD (jolley ranchers or Stover's)   NO OIL BASED VITAMINS - use powdered substitutes.  If not improving on this combination you need a sinus ct call [facility] [phone]

## 2013-05-05 ENCOUNTER — Encounter: Payer: Self-pay | Admitting: Internal Medicine

## 2013-05-05 ENCOUNTER — Ambulatory Visit
Admission: RE | Admit: 2013-05-05 | Discharge: 2013-05-05 | Disposition: A | Discharge status: Home / Self Care | Payer: BC Managed Care – PPO | Source: Ambulatory Visit | Attending: Internal Medicine | Admitting: Internal Medicine

## 2013-05-05 DIAGNOSIS — R059 Cough, unspecified: Secondary | ICD-10-CM

## 2013-05-05 DIAGNOSIS — R05 Cough: Secondary | ICD-10-CM

## 2013-05-05 NOTE — Progress Notes (Signed)
Quick Note:  Advised pt of CT results per MW. Pt verbalized understanding and had no further questions at this time ______

## 2013-05-10 ENCOUNTER — Other Ambulatory Visit: Payer: Self-pay | Admitting: Gastroenterology

## 2013-05-10 DIAGNOSIS — R1310 Dysphagia, unspecified: Secondary | ICD-10-CM

## 2013-05-15 ENCOUNTER — Ambulatory Visit
Admission: RE | Admit: 2013-05-15 | Discharge: 2013-05-15 | Disposition: A | Discharge status: Home / Self Care | Payer: BC Managed Care – PPO | Source: Ambulatory Visit | Attending: Gastroenterology | Admitting: Gastroenterology

## 2013-05-15 DIAGNOSIS — R1310 Dysphagia, unspecified: Secondary | ICD-10-CM

## 2014-03-17 ENCOUNTER — Other Ambulatory Visit: Payer: Self-pay | Admitting: Cardiovascular Disease

## 2014-03-19 NOTE — Telephone Encounter (Signed)
Rx was sent to pharmacy electronically. 

## 2014-05-26 ENCOUNTER — Other Ambulatory Visit: Payer: Self-pay | Admitting: Cardiovascular Disease

## 2014-05-28 NOTE — Telephone Encounter (Signed)
Rx was sent to pharmacy electronically. 

## 2014-07-03 ENCOUNTER — Encounter: Payer: Self-pay | Admitting: Cardiovascular Disease

## 2014-07-03 ENCOUNTER — Ambulatory Visit (INDEPENDENT_AMBULATORY_CARE_PROVIDER_SITE_OTHER): Payer: Medicare Other | Admitting: Cardiovascular Disease

## 2014-07-03 VITALS — BP 116/72 | HR 58 | Ht 68.0 in | Wt 174.9 lb

## 2014-07-03 DIAGNOSIS — Z8249 Family history of ischemic heart disease and other diseases of the circulatory system: Secondary | ICD-10-CM

## 2014-07-03 DIAGNOSIS — E785 Hyperlipidemia, unspecified: Secondary | ICD-10-CM

## 2014-07-03 DIAGNOSIS — Z79899 Other long term (current) drug therapy: Secondary | ICD-10-CM

## 2014-07-03 NOTE — Assessment & Plan Note (Signed)
On statin therapy followed by his PCP 

## 2014-07-03 NOTE — Progress Notes (Signed)
07/03/2014 Kyle Hansen   [birth year]  [id number]  Primary Physician Delorse LekBURNETT,BRENT A, MD Primary Cardiologist: Runell GessJonathan J. Berry MD Roseanne RenoFACP,FACC,FAHA, FSCAI   HPI:  The patient is a 59 year old, thin-appearing, married Caucasian male, father of 2, grandfather to 2 grandchildren who I saw a year ago. He is referred to me because of positive risk factors. His wife Zella BallRobin is also a patient of mine. He has a history of hyperlipidemia as well as a strong family history for heart disease with a father that had his first MI at 2248. His brother had his first MI at age 59 and bypass grafting at 7450. He has never had a heart attack or stroke. He denies chest pain or shortness of breath. He had a Myoview stress test performed a year ago in our office which is normal and carotid Dopplers that showed no evidence of ICA stenosis. Since I saw him a year ago he denies chest pain or shortness of breath.   Current Outpatient Prescriptions  Medication Sig Dispense Refill  . aspirin 81 MG tablet Take 81 mg by mouth daily.      . Coenzyme Q10 (COQ10) 200 MG CAPS Take by mouth.    . Multiple Vitamin (MULTIVITAMIN) capsule Take 1 capsule by mouth daily.      . pravastatin (PRAVACHOL) 20 MG tablet Take 1 tablet (20 mg total) by mouth daily. <appointment 07/03/2014> 90 tablet 0  . traMADol (ULTRAM) 50 MG tablet Take 1 tablet by mouth daily.    . vitamin B-12 (CYANOCOBALAMIN) 100 MCG tablet Take 50 mcg by mouth. Take 1 tablet twice a day.     No current facility-administered medications for this visit.    Allergies  Allergen Reactions  . Barbiturates Other (See Comments)    Can cause activity of porphyria  . Chloramphenicols Other (See Comments)    Can induce porphyria  . Ergot Alkaloids Other (See Comments)    Can induce porphyria  . Griseofulvin Other (See Comments)    Can induce porphyria  . Librium Other (See Comments)    Can induce porphyria  . Methsuximide Other (See Comments)    Can induce  porphyria  . [place] [Meprobamate] Other (See Comments)    Can induce porphyria  . Orinase [Tolbutamide] Other (See Comments)    Can induce porphyria  . Sulfonamide Derivatives Other (See Comments)    Can induce porphyria  . Tofranil-Pm Other (See Comments)    Can induce porphyria  . Chlordiazepoxide   . Imipramine   . Phenytoin   . Phenytoin Sodium Extended Other (See Comments)    Can induce porphyria  . Sulfa Antibiotics     History   Social History  . Marital Status: Married    Spouse Name: Ellamae SiaRobin Mensch    Number of Children: 2  . Years of Education: N/A   Occupational History  . Retired    Social History Main Topics  . Smoking status: Never Smoker   . Smokeless tobacco: Current User  . Alcohol Use: No  . Drug Use: No  . Sexual Activity: Not on file   Other Topics Concern  . Not on file   Social History Narrative     Review of Systems: General: negative for chills, fever, night sweats or weight changes.  Cardiovascular: negative for chest pain, dyspnea on exertion, edema, orthopnea, palpitations, paroxysmal nocturnal dyspnea or shortness of breath Dermatological: negative for rash Respiratory: negative for cough or wheezing Urologic: negative for hematuria Abdominal: negative for  nausea, vomiting, diarrhea, bright red blood per rectum, melena, or hematemesis Neurologic: negative for visual changes, syncope, or dizziness All other systems reviewed and are otherwise negative except as noted above.    Blood pressure 116/72, pulse 58, height  (1.727 m), weight 174 lb 14.4 oz (79.334 kg).  General appearance: alert and no distress Neck: no adenopathy, no carotid bruit, no JVD, supple, symmetrical, trachea midline and thyroid not enlarged, symmetric, no tenderness/mass/nodules Lungs: clear to auscultation bilaterally Heart: regular rate and rhythm, S1, S2 normal, no murmur, click, rub or gallop Extremities: extremities normal, atraumatic, no cyanosis  or edema  EKG normal sinus rhythm without ST or T-wave changes I personally reviewed this EKG  ASSESSMENT AND PLAN:   No problem-specific assessment & plan notes found for this encounter.      Runell Gess MD FACP,FACC,FAHA, [facility] 07/03/2014 5:01 PM

## 2014-07-03 NOTE — Patient Instructions (Signed)
Dr. Allyson SabalBerry has ordered for you to have lab work done in the next week or two and you should be FASTING.  Your physician wants you to follow-up in 1 year with Dr. Allyson SabalBerry. You will receive a reminder letter in the mail 2 months in advance. If you do not receive a letter, please call our office to schedule the follow-up appointment.

## 2014-07-10 LAB — HEPATIC FUNCTION PANEL
ALBUMIN: 3.8 g/dL (ref 3.5–5.2)
ALT: 18 U/L (ref 0–53)
AST: 26 U/L (ref 0–37)
Alkaline Phosphatase: 59 U/L (ref 39–117)
BILIRUBIN DIRECT: 0.2 mg/dL (ref 0.0–0.3)
BILIRUBIN TOTAL: 1 mg/dL (ref 0.2–1.2)
Indirect Bilirubin: 0.8 mg/dL (ref 0.2–1.2)
Total Protein: 7 g/dL (ref 6.0–8.3)

## 2014-07-10 LAB — LIPID PANEL
Cholesterol: 207 mg/dL — ABNORMAL HIGH (ref 0–200)
HDL: 58 mg/dL (ref >39)
LDL CALC: 132 mg/dL — AB (ref 0–99)
Total CHOL/HDL Ratio: 3.6 Ratio
Triglycerides: 84 mg/dL (ref <150)
VLDL: 17 mg/dL (ref 0–40)

## 2014-07-23 ENCOUNTER — Telehealth: Payer: Self-pay | Admitting: Cardiovascular Disease

## 2014-07-23 DIAGNOSIS — Z79899 Other long term (current) drug therapy: Secondary | ICD-10-CM

## 2014-07-23 DIAGNOSIS — E785 Hyperlipidemia, unspecified: Secondary | ICD-10-CM

## 2014-07-23 NOTE — Telephone Encounter (Signed)
Returned call to patient he stated he would like to know 07/10/14 lab results.Advised results not available.Will send message to Dr.Berry's nurse.

## 2014-07-23 NOTE — Telephone Encounter (Signed)
Per Answering Service-Pt would like blood test results.

## 2014-07-25 MED ORDER — ATORVASTATIN CALCIUM 80 MG PO TABS
80.0000 mg | ORAL_TABLET | Freq: Every day | ORAL | Status: DC
Start: 2014-07-25 — End: 2014-08-01

## 2014-07-25 NOTE — Telephone Encounter (Signed)
Patient notified of medication change and is agreeable.  RX sent in for atorvastatin 80mg  and repeat lab slip mailed to patient.

## 2014-07-25 NOTE — Telephone Encounter (Signed)
lmom 

## 2014-07-25 NOTE — Telephone Encounter (Signed)
-----   Message from Runell GessJonathan J Berry, MD sent at 07/24/2014  2:10 PM EST ----- Regarding: RE: lab results Change prava to atorva 80 and recheck  JJB ----- Message -----    From: Marella BileKathryn W Willine Schwalbe, RN    Sent: 07/23/2014   4:17 PM      To: Runell GessJonathan J Berry, MD Subject: lab results                                    Hey, can you take a look at this patient's lipid and liver from Jan 11?  For some reason you looked at it, but didn't result it.    Thanks!

## 2014-07-26 ENCOUNTER — Telehealth: Payer: Self-pay | Admitting: Cardiovascular Disease

## 2014-07-26 NOTE — Telephone Encounter (Signed)
Please call,the medicine Dr Allyson SabalBerry wants him to take makes his joints hurt. He said he talked to you yesterday.

## 2014-07-26 NOTE — Telephone Encounter (Signed)
Patient called back and said that the atorvastatin caused him myalgias.  He commented that he was changed from atorvastatin to pravastatin. I will defer to Dr Allyson SabalBerry.

## 2014-07-27 NOTE — Telephone Encounter (Signed)
[facility]

## 2014-07-30 NOTE — Telephone Encounter (Signed)
I spoke with patient and made him an appt to see Kyle Hansen on 2/3 to discuss lipids

## 2014-08-01 ENCOUNTER — Encounter: Payer: Self-pay | Admitting: Pharmacist Clinician (PhC)/ Clinical Pharmacy Specialist

## 2014-08-01 ENCOUNTER — Ambulatory Visit (INDEPENDENT_AMBULATORY_CARE_PROVIDER_SITE_OTHER): Payer: Medicare Other | Admitting: Pharmacist Clinician (PhC)/ Clinical Pharmacy Specialist

## 2014-08-01 VITALS — Ht 68.0 in | Wt 176.7 lb

## 2014-08-01 DIAGNOSIS — E785 Hyperlipidemia, unspecified: Secondary | ICD-10-CM

## 2014-08-01 MED ORDER — PRAVASTATIN SODIUM 40 MG PO TABS: 40.0000 mg | ORAL_TABLET | Freq: Every evening | ORAL | Status: DC

## 2014-08-01 NOTE — Patient Instructions (Signed)
Increase your pravastatin to 40 mg each night.  If you develop muscle problems stop for 5 days, then restart at 20 mg daily  Decrease the number of eggs you eat each week, increase the fiber in your diet (oatmeal, bran cereals, almonds)  Repeat labs on April 28.  We'll call you with those results

## 2014-08-01 NOTE — Progress Notes (Signed)
08/01/2014 Kyle Hansen Aug 05, 1955 [id number]   HPI:  Kyle Hansen is a 59 y.o. male patient of Dr Allyson SabalBerry, who presents today for a lipid clinic evaluation.  RF:  None, primary prevention  Meds: pravastatin 20 mg qd    Intolerant: atorvastatin 10 mg qd (noted in paper chart) - caused severe muscle weakness  Family history: father MI at 1348, died with second MI at 1671, brother MI at 6035, CABG at 6150, died with second MI at 2951  Diet: EggBeaters and toast most mornings, PB sandwiches at lunch, light dinner  Exercise: none currently, has tennis elbow, going to therapy twice weekly right now    Labs:  Results for Kyle Hansen, Kyle Hansen (MRN [id number]) as of 08/01/2014 16:22  Ref. Range 07/09/2014 08:27 08/2012  Cholesterol Latest Range: 0-200 mg/dL 284207 (H) 132163  Triglycerides Latest Range: <150 mg/dL 84 51  HDL Latest Range: >39 mg/dL 58 55  LDL (calc) Latest Range: 0-99 mg/dL 440132 (H) 98  VLDL Latest Range: 0-40 mg/dL 17   Total CHOL/HDL Ratio Latest Units: Ratio 3.6      Current Outpatient Prescriptions  Medication Sig Dispense Refill  . aspirin 81 MG tablet Take 81 mg by mouth daily.      . Coenzyme Q10 (COQ10) 200 MG CAPS Take by mouth.    . Multiple Vitamin (MULTIVITAMIN) capsule Take 1 capsule by mouth daily.      . traMADol (ULTRAM) 50 MG tablet Take 1 tablet by mouth daily.    . vitamin B-12 (CYANOCOBALAMIN) 100 MCG tablet Take 50 mcg by mouth. Take 1 tablet twice a day.     No current facility-administered medications for this visit.    Allergies  Allergen Reactions  . Barbiturates Other (See Comments)    Can cause activity of porphyria  . Chloramphenicols Other (See Comments)    Can induce porphyria  . Ergot Alkaloids Other (See Comments)    Can induce porphyria  . Griseofulvin Other (See Comments)    Can induce porphyria  . Librium Other (See Comments)    Can induce porphyria  . Methsuximide Other (See Comments)    Can induce porphyria  . [place]  [Meprobamate] Other (See Comments)    Can induce porphyria  . Orinase [Tolbutamide] Other (See Comments)    Can induce porphyria  . Sulfonamide Derivatives Other (See Comments)    Can induce porphyria  . Tofranil-Pm Other (See Comments)    Can induce porphyria  . Atorvastatin Other (See Comments)    Muscle aches  . Chlordiazepoxide   . Imipramine   . Phenytoin   . Phenytoin Sodium Extended Other (See Comments)    Can induce porphyria  . Sulfa Antibiotics     Past Medical History  Diagnosis Date  . Porphyria     multiple drugs to avoidsome meds are safe  . Blood dyscrasia   . Family history of premature CAD   . Normal cardiac stress test 12/17/2010    though there was a marked hypertensive response   . Hyperlipidemia LDL goal < 70   . Mild carotid artery disease 11/2010    bilateral    Height 5\' 8"  (1.727 m), weight 176 lb 11.2 oz (80.151 kg).    Phillips HayKristin Alvstad PharmD CPP [facility]

## 2014-08-01 NOTE — Assessment & Plan Note (Signed)
Pt LDL cholesterol has increased since last recorded in 2014.  He is primary prevention, but with a strong family history of MI (father first at 1148, brother at 835, both now deceased).  Unfortunately he does not meet the criteria for PCSK-9 inhibitors at this time.  He recalls taking atorvastatin in the past, per our paper chart the dose was 10 mg daily, and this left him with severe muscle weakness.  He currently tolerates pravastatin 20 mg without incident.  Patient has option of adding Zetia to pravastatin or trying to push the dose to 40 mg.  He would like to start by increasing the pravastatin to 40 mg daily.  He also will work on increasing dietary fiber and decreasing carbohydrates (white foods).  He hopes to help bring the number down with some dietary changes as well as the increase in medication.  I will have him repeat labs in 3 months.  If he is still not to goal (Dr. Allyson SabalBerry would like <70) then we can consider adding Zetia to his regimen.  Pt understands if the increased dose of pravastatin causes muscle weakness, he is to stop for 5 days then resume at 20 mg daily.

## 2014-10-26 LAB — LIPID PANEL
Cholesterol: 177 mg/dL (ref 0–200)
HDL: 51 mg/dL (ref >=40)
LDL CALC: 113 mg/dL — AB (ref 0–99)
Total CHOL/HDL Ratio: 3.5 Ratio
Triglycerides: 64 mg/dL (ref <150)
VLDL: 13 mg/dL (ref 0–40)

## 2014-10-26 LAB — HEPATIC FUNCTION PANEL
ALK PHOS: 62 U/L (ref 39–117)
ALT: 13 U/L (ref 0–53)
AST: 21 U/L (ref 0–37)
Albumin: 3.8 g/dL (ref 3.5–5.2)
BILIRUBIN DIRECT: 0.2 mg/dL (ref 0.0–0.3)
BILIRUBIN TOTAL: 0.8 mg/dL (ref 0.2–1.2)
Indirect Bilirubin: 0.6 mg/dL (ref 0.2–1.2)
Total Protein: 6.6 g/dL (ref 6.0–8.3)

## 2014-10-29 ENCOUNTER — Telehealth: Payer: Self-pay | Admitting: Pharmacist Clinician (PhC)/ Clinical Pharmacy Specialist

## 2014-10-29 ENCOUNTER — Encounter: Payer: Self-pay | Admitting: *Deleted

## 2014-10-30 NOTE — Telephone Encounter (Signed)
Spoke with patient, reviewed labs.  Pt will continue to work on lifestyle modifications to keep cholesterol levels where they are now.  Also continue pravastatin 40 mg qd

## 2015-07-07 ENCOUNTER — Other Ambulatory Visit: Payer: Self-pay | Admitting: Cardiovascular Disease

## 2015-07-08 NOTE — Telephone Encounter (Signed)
Rx(s) sent to pharmacy electronically.  

## 2015-09-17 ENCOUNTER — Encounter: Payer: Self-pay | Admitting: Cardiovascular Disease

## 2015-09-17 ENCOUNTER — Ambulatory Visit (INDEPENDENT_AMBULATORY_CARE_PROVIDER_SITE_OTHER): Payer: Medicare Other | Admitting: Cardiovascular Disease

## 2015-09-17 VITALS — BP 128/82 | HR 59 | Ht 68.0 in | Wt 181.2 lb

## 2015-09-17 DIAGNOSIS — Z8249 Family history of ischemic heart disease and other diseases of the circulatory system: Secondary | ICD-10-CM | POA: Diagnosis not present

## 2015-09-17 DIAGNOSIS — E785 Hyperlipidemia, unspecified: Secondary | ICD-10-CM | POA: Diagnosis not present

## 2015-09-17 MED ORDER — PRAVASTATIN SODIUM 40 MG PO TABS: 40.0000 mg | ORAL_TABLET | Freq: Every evening | ORAL | Status: DC

## 2015-09-17 NOTE — Progress Notes (Signed)
09/17/2015 Kyle Hansen   [birth year]  [id number]  Primary Physician Kyle LekBURNETT,BRENT A, MD Primary Cardiologist: Kyle GessJonathan J. Annamae Shivley MD Kyle Hansen   HPI:  The patient is Hansen 60 year old, thin-appearing, married Caucasian male, father of 2, grandfather to 2 grandchildren who I saw 07/03/14. He is referred to me because of positive risk factors. His wife Kyle BallRobin is also Hansen patient of mine. He has Hansen history of hyperlipidemia as well as Hansen strong family history for heart disease with Hansen father that had his first MI at 7548. His brother had his first MI at age 60 and bypass grafting at 3550. He has never had Hansen heart attack or stroke. He denies chest pain or shortness of breath. He had Hansen Myoview stress test performed several years ago in our office which is normal and carotid Dopplers that showed no evidence of ICA stenosis. Since I saw him Hansen year ago he denies chest pain or shortness of breath.   Current Outpatient Prescriptions  Medication Sig Dispense Refill  . aspirin 81 MG tablet Take 81 mg by mouth daily.      . Coenzyme Q10 (COQ10) 200 MG CAPS Take 1 tablet by mouth daily.     . Multiple Vitamin (MULTIVITAMIN) capsule Take 1 capsule by mouth daily.      . pravastatin (PRAVACHOL) 40 MG tablet TAKE 1 TABLET EVERY EVENING 90 tablet 2  . vitamin B-12 (CYANOCOBALAMIN) 100 MCG tablet Take 50 mcg by mouth. Take 1 tablet twice Hansen day.     No current facility-administered medications for this visit.    Allergies  Allergen Reactions  . Barbiturates Other (See Comments)    Can cause activity of porphyria  . Chloramphenicols Other (See Comments)    Can induce porphyria  . Griseofulvin Other (See Comments)    Can induce porphyria  . Librium Other (See Comments)    Can induce porphyria  . Methsuximide Other (See Comments)    Can induce porphyria  . [place] [Meprobamate] Other (See Comments)    Can induce porphyria  . Nicergoline Other (See Comments)    Can induce porphyria  . Orinase  [Tolbutamide] Other (See Comments)    Can induce porphyria  . Sulfonamide Derivatives Other (See Comments)    Can induce porphyria  . Tofranil-Pm Other (See Comments)    Can induce porphyria  . Atorvastatin Other (See Comments)    Muscle aches  . Chlordiazepoxide   . Imipramine   . Phenytoin   . Phenytoin Sodium Extended Other (See Comments)    Can induce porphyria  . Sulfa Antibiotics     Social History   Social History  . Marital Status: Married    Spouse Name: Kyle Hansen  . Number of Children: 2  . Years of Education: N/Hansen   Occupational History  . Retired    Social History Main Topics  . Smoking status: Never Smoker   . Smokeless tobacco: Current User  . Alcohol Use: No  . Drug Use: No  . Sexual Activity: Not on file   Other Topics Concern  . Not on file   Social History Narrative     Review of Systems: General: negative for chills, fever, night sweats or weight changes.  Cardiovascular: negative for chest pain, dyspnea on exertion, edema, orthopnea, palpitations, paroxysmal nocturnal dyspnea or shortness of breath Dermatological: negative for rash Respiratory: negative for cough or wheezing Urologic: negative for hematuria Abdominal: negative for nausea, vomiting, diarrhea, bright red blood per rectum, melena,  or hematemesis Neurologic: negative for visual changes, syncope, or dizziness All other systems reviewed and are otherwise negative except as noted above.    Blood pressure 128/82, pulse 59, height  (1.727 m), weight 181 lb 3.2 oz (82.192 kg).  General appearance: alert and no distress Neck: no adenopathy, no carotid bruit, no JVD, supple, symmetrical, trachea midline and thyroid not enlarged, symmetric, no tenderness/mass/nodules Lungs: clear to auscultation bilaterally Heart: regular rate and rhythm, S1, S2 normal, no murmur, click, rub or gallop Extremities: extremities normal, atraumatic, no cyanosis or edema  EKG sinus bradycardia  59 without ST or T-wave changes. I personally reviewed this EKG  ASSESSMENT AND PLAN:   Hyperlipidemia with target LDL less than 70 History of hyperlipidemia on pravastatin. It's been Hansen year since his last lipid profile. I will recheck Hansen lipid and liver profile      Kyle Gess MD [facility], [facility] 09/17/2015 3:16 PM

## 2015-09-17 NOTE — Patient Instructions (Signed)
Medication Instructions:  Your physician recommends that you continue on your current medications as directed. Please refer to the Current Medication list given to you today.   Labwork: Your physician recommends that you return for lab work in: FASTING (lipid/liver) The lab can be found on the FIRST FLOOR of out building in [address]   Testing/Procedures: none  Follow-Up: Your physician wants you to follow-up in: 12 months with Dr. Berry. You will receive a reminder letter in the mail two months in advance. If you don't receive a letter, please call our office to schedule the follow-up appointment.   Any Other Special Instructions Will Be Listed Below (If Applicable).     If you need a refill on your cardiac medications before your next appointment, please call your pharmacy.   

## 2015-09-17 NOTE — Assessment & Plan Note (Signed)
History of hyperlipidemia on pravastatin. It's been a year since his last lipid profile. I will recheck a lipid and liver profile

## 2015-09-24 ENCOUNTER — Ambulatory Visit: Payer: Medicare Other | Admitting: Cardiovascular Disease

## 2015-09-25 LAB — LIPID PANEL
CHOL/HDL RATIO: 3.5 ratio (ref <=5.0)
CHOLESTEROL: 188 mg/dL (ref 125–200)
HDL: 54 mg/dL (ref >=40)
LDL Cholesterol: 121 mg/dL (ref <130)
TRIGLYCERIDES: 63 mg/dL (ref <150)
VLDL: 13 mg/dL (ref <30)

## 2015-09-25 LAB — HEPATIC FUNCTION PANEL
ALBUMIN: 3.8 g/dL (ref 3.6–5.1)
ALT: 16 U/L (ref 9–46)
AST: 27 U/L (ref 10–35)
Alkaline Phosphatase: 53 U/L (ref 40–115)
BILIRUBIN DIRECT: 0.1 mg/dL (ref <=0.2)
BILIRUBIN TOTAL: 0.7 mg/dL (ref 0.2–1.2)
Indirect Bilirubin: 0.6 mg/dL (ref 0.2–1.2)
Total Protein: 7 g/dL (ref 6.1–8.1)

## 2015-10-02 ENCOUNTER — Telehealth: Payer: Self-pay | Admitting: Cardiovascular Disease

## 2015-10-02 NOTE — Telephone Encounter (Signed)
See results communication  - Elnita MaxwellCheryl advised patient on recent cholesterol readings and sent Dr. Allyson SabalBerry inquiry concerning medication changes.

## 2015-10-02 NOTE — Telephone Encounter (Signed)
Follow Up   Pt called again for cholesterol readings. Pt states that he will come to the office and get the readings if he doesn't get a call back. Would also like to take to Dr. Allyson SabalBerry himself

## 2015-10-02 NOTE — Telephone Encounter (Signed)
Routing to NL

## 2015-10-17 ENCOUNTER — Telehealth: Payer: Self-pay | Admitting: *Deleted

## 2015-10-17 DIAGNOSIS — E785 Hyperlipidemia, unspecified: Secondary | ICD-10-CM

## 2015-10-17 MED ORDER — EZETIMIBE 10 MG PO TABS: 10.0000 mg | ORAL_TABLET | Freq: Every day | ORAL | Status: DC

## 2015-10-17 NOTE — Telephone Encounter (Signed)
Results called to pt. Instructed pt to start Zetia 10 mg by mouth each evening and to get lab work done at the end of July. Instructed pt to be fasting prior to lab work. Lab slips mailed to pt. Pt verbalized understanding.  Rx sent to pharmacy.

## 2015-10-17 NOTE — Telephone Encounter (Signed)
-----   Message from Runell GessJonathan J Berry, MD sent at 09/29/2015  9:44 AM EDT ----- Increase Prav to 80 and recheck. Not at goal for primary prevention

## 2015-10-22 ENCOUNTER — Telehealth: Payer: Self-pay | Admitting: Cardiovascular Disease

## 2015-10-22 MED ORDER — ROSUVASTATIN CALCIUM 10 MG PO TABS: 10.0000 mg | ORAL_TABLET | ORAL | Status: DC

## 2015-10-22 NOTE — Telephone Encounter (Signed)
Can have patient stop pravastatin and try Crestor 10mg  three times a week. Crestor is more potent and generally associated with less muscle weakness.

## 2015-10-22 NOTE — Telephone Encounter (Signed)
Left msg for [facility] pharmacy to inform them I'd inquire on a suitable recommendation for this patient. No alternatives to Zetia - pt on pravastatin & CoQ 10, anything else recommended?

## 2015-10-22 NOTE — Telephone Encounter (Signed)
Called patient. Explained recommendations. He notes he never started the Zetia, has no problems w/ stopping the pravastatin and trying crestor. Informed him I would send for 30 day supply (15 tablets) to pharmacy w/ refills. Advised to call if he notes SE's w/ this med.  Advised to call again in 2-3 months if no problems - would refill further and order labwork (lipids and liver). Pt voiced understanding and agreement w/ plan.

## 2015-10-22 NOTE — Telephone Encounter (Signed)
New Message  Rep from [facility] calling to speak w/ RN cocnerning a possible alternative to pt's Zetia- inquiring about a change due to high Copay. Please call back and discuss.

## 2016-01-23 LAB — LIPID PANEL
CHOLESTEROL: 172 mg/dL (ref 125–200)
HDL: 67 mg/dL (ref >=40)
LDL CALC: 96 mg/dL (ref <130)
TRIGLYCERIDES: 47 mg/dL (ref <150)
Total CHOL/HDL Ratio: 2.6 Ratio (ref <=5.0)
VLDL: 9 mg/dL (ref <30)

## 2016-01-23 LAB — HEPATIC FUNCTION PANEL
ALT: 18 U/L (ref 9–46)
AST: 30 U/L (ref 10–35)
Albumin: 3.9 g/dL (ref 3.6–5.1)
Alkaline Phosphatase: 57 U/L (ref 40–115)
BILIRUBIN DIRECT: 0.2 mg/dL (ref <=0.2)
BILIRUBIN TOTAL: 0.8 mg/dL (ref 0.2–1.2)
Indirect Bilirubin: 0.6 mg/dL (ref 0.2–1.2)
Total Protein: 7.3 g/dL (ref 6.1–8.1)

## 2016-01-24 ENCOUNTER — Encounter: Payer: Self-pay | Admitting: *Deleted

## 2016-02-03 ENCOUNTER — Other Ambulatory Visit: Payer: Self-pay | Admitting: Cardiovascular Disease

## 2016-02-03 MED ORDER — ROSUVASTATIN CALCIUM 10 MG PO TABS: 10.0000 mg | ORAL_TABLET | ORAL | 11 refills | Status: DC

## 2016-08-20 ENCOUNTER — Other Ambulatory Visit: Payer: Self-pay

## 2016-08-20 MED ORDER — ROSUVASTATIN CALCIUM 10 MG PO TABS: 10.0000 mg | ORAL_TABLET | ORAL | 0 refills | Status: DC

## 2016-08-20 NOTE — Telephone Encounter (Signed)
Rx(s) sent to pharmacy electronically.  

## 2016-12-29 ENCOUNTER — Ambulatory Visit: Payer: Self-pay | Admitting: Cardiovascular Disease

## 2017-01-01 ENCOUNTER — Encounter: Payer: Self-pay | Admitting: Cardiovascular Disease

## 2017-01-01 ENCOUNTER — Ambulatory Visit (INDEPENDENT_AMBULATORY_CARE_PROVIDER_SITE_OTHER): Payer: Medicare HMO | Admitting: Cardiovascular Disease

## 2017-01-01 VITALS — BP 137/77 | HR 52 | Ht 68.0 in | Wt 180.0 lb

## 2017-01-01 DIAGNOSIS — I739 Peripheral vascular disease, unspecified: Secondary | ICD-10-CM

## 2017-01-01 DIAGNOSIS — E785 Hyperlipidemia, unspecified: Secondary | ICD-10-CM

## 2017-01-01 DIAGNOSIS — I779 Disorder of arteries and arterioles, unspecified: Secondary | ICD-10-CM | POA: Diagnosis not present

## 2017-01-01 NOTE — Progress Notes (Signed)
01/01/2017 Kyle Hansen   03/24/1956  [id number]  Primary Physician Kyle LekBurnett, Brent A, MD Primary Cardiologist: Kyle GessJonathan J Sheanna Dail MD Kyle Hansen  HPI:  The patient is Hansen 61 year old, thin-appearing, married Caucasian male, father of 2, grandfather to 2 grandchildren who I saw 09/17/15. He is referred to me because of positive risk factors. His wife Kyle Hansen is also Hansen patient of mine. Apparently she was recently diagnosed with breast cancer and underwent surgery for this recently. He has Hansen history of hyperlipidemia as well as Hansen strong family history for heart disease with Hansen father that had his first MI at 1848. His brother had his first MI at age 61 and bypass grafting at 950. He has never had Hansen heart attack or stroke. He denies chest pain or shortness of breath. He had Hansen Myoview stress test performed several years ago in our office which is normal and carotid Dopplers that showed no evidence of ICA stenosis. Since I saw him Hansen year ago he denies chest pain or shortness of breath.    Current Outpatient Prescriptions  Medication Sig Dispense Refill  . aspirin 81 MG tablet Take 81 mg by mouth daily.      . Coenzyme Q10 (COQ10) 200 MG CAPS Take 1 tablet by mouth daily.     . Multiple Vitamin (MULTIVITAMIN) capsule Take 1 capsule by mouth daily.      . rosuvastatin (CRESTOR) 10 MG tablet Take 1 tablet (10 mg total) by mouth 3 (three) times Hansen week. 45 tablet 0   No current facility-administered medications for this visit.     Allergies  Allergen Reactions  . Barbiturates Other (See Comments)    Can cause activity of porphyria  . Chloramphenicols Other (See Comments)    Can induce porphyria  . Griseofulvin Other (See Comments)    Can induce porphyria  . Librium Other (See Comments)    Can induce porphyria  . Methsuximide Other (See Comments)    Can induce porphyria  . [place] [Meprobamate] Other (See Comments)    Can induce porphyria  . Nicergoline Other (See Comments)   Can induce porphyria  . Orinase [Tolbutamide] Other (See Comments)    Can induce porphyria  . Sulfonamide Derivatives Other (See Comments)    Can induce porphyria  . Tofranil-Pm Other (See Comments)    Can induce porphyria  . Atorvastatin Other (See Comments)    Muscle aches  . Chlordiazepoxide   . Imipramine   . Phenytoin   . Phenytoin Sodium Extended Other (See Comments)    Can induce porphyria  . Sulfa Antibiotics     Social History   Social History  . Marital status: Married    Spouse name: Kyle Hansen  . Number of children: 2  . Years of education: N/Hansen   Occupational History  . Retired    Social History Main Topics  . Smoking status: Never Smoker  . Smokeless tobacco: Current User  . Alcohol use No  . Drug use: No  . Sexual activity: Not on file   Other Topics Concern  . Not on file   Social History Narrative  . No narrative on file     Review of Systems: General: negative for chills, fever, night sweats or weight changes.  Cardiovascular: negative for chest pain, dyspnea on exertion, edema, orthopnea, palpitations, paroxysmal nocturnal dyspnea or shortness of breath Dermatological: negative for rash Respiratory: negative for cough or wheezing Urologic: negative for hematuria Abdominal: negative for nausea,  vomiting, diarrhea, bright red blood per rectum, melena, or hematemesis Neurologic: negative for visual changes, syncope, or dizziness All other systems reviewed and are otherwise negative except as noted above.    Blood pressure 137/77, pulse (!) 52, height 5\' 8"  (1.727 m), weight 180 lb (81.6 kg).  General appearance: alert and no distress Neck: no adenopathy, no carotid bruit, no JVD, supple, symmetrical, trachea midline and thyroid not enlarged, symmetric, no tenderness/mass/nodules Lungs: clear to auscultation bilaterally Heart: regular rate and rhythm, S1, S2 normal, no murmur, click, rub or gallop Extremities: extremities normal,  atraumatic, no cyanosis or edema  EKG sinus bradycardia 52 with an ST or T-wave changes. I personally reviewed this EKG.  ASSESSMENT AND PLAN:   Hyperlipidemia with target LDL less than 70 History of hyperlipidemia on statin therapy. We will recheck Hansen lipid and liver profile      Kyle Gess MD [facility], [facility] 01/01/2017 2:05 PM

## 2017-01-01 NOTE — Assessment & Plan Note (Signed)
History of hyperlipidemia on statin therapy. We will recheck a lipid and liver profile 

## 2017-01-01 NOTE — Patient Instructions (Signed)

## 2017-01-04 LAB — HEPATIC FUNCTION PANEL
ALBUMIN: 4 g/dL (ref 3.6–4.8)
ALT: 20 IU/L (ref 0–44)
AST: 23 IU/L (ref 0–40)
Alkaline Phosphatase: 66 IU/L (ref 39–117)
BILIRUBIN TOTAL: 0.5 mg/dL (ref 0.0–1.2)
BILIRUBIN, DIRECT: 0.16 mg/dL (ref 0.00–0.40)
Total Protein: 6.8 g/dL (ref 6.0–8.5)

## 2017-01-04 LAB — LIPID PANEL
CHOL/HDL RATIO: 2.5 ratio (ref 0.0–5.0)
CHOLESTEROL TOTAL: 160 mg/dL (ref 100–199)
HDL: 64 mg/dL (ref >39)
LDL Calculated: 86 mg/dL (ref 0–99)
Triglycerides: 51 mg/dL (ref 0–149)
VLDL Cholesterol Cal: 10 mg/dL (ref 5–40)

## 2017-01-06 ENCOUNTER — Encounter: Payer: Self-pay | Admitting: Cardiovascular Disease

## 2017-01-06 ENCOUNTER — Telehealth: Payer: Self-pay | Admitting: Cardiovascular Disease

## 2017-01-06 MED ORDER — ROSUVASTATIN CALCIUM 10 MG PO TABS: 10.0000 mg | ORAL_TABLET | ORAL | 4 refills | Status: DC

## 2017-01-06 NOTE — Telephone Encounter (Signed)
Results given to pt. Pt verbalized understanding. [facility] [place] and letter w/ results mailed.  Refill sent and letter placed in mail.

## 2017-02-15 ENCOUNTER — Other Ambulatory Visit: Payer: Self-pay | Admitting: Cardiovascular Disease

## 2017-05-15 ENCOUNTER — Other Ambulatory Visit: Payer: Self-pay | Admitting: Cardiovascular Disease

## 2017-08-13 ENCOUNTER — Other Ambulatory Visit: Payer: Self-pay | Admitting: *Deleted

## 2017-08-13 MED ORDER — ROSUVASTATIN CALCIUM 10 MG PO TABS: 10.0000 mg | ORAL_TABLET | ORAL | 1 refills | Status: DC

## 2017-11-24 ENCOUNTER — Other Ambulatory Visit: Payer: Self-pay | Admitting: Family Medicine

## 2017-11-24 DIAGNOSIS — R1032 Left lower quadrant pain: Secondary | ICD-10-CM

## 2017-11-24 DIAGNOSIS — N509 Disorder of male genital organs, unspecified: Secondary | ICD-10-CM

## 2017-11-30 ENCOUNTER — Ambulatory Visit
Admission: RE | Admit: 2017-11-30 | Discharge: 2017-11-30 | Disposition: A | Discharge status: Home / Self Care | Payer: Medicare HMO | Source: Ambulatory Visit | Attending: Family Medicine | Admitting: Family Medicine

## 2017-11-30 DIAGNOSIS — N509 Disorder of male genital organs, unspecified: Secondary | ICD-10-CM

## 2017-11-30 DIAGNOSIS — R1032 Left lower quadrant pain: Secondary | ICD-10-CM

## 2017-12-02 ENCOUNTER — Other Ambulatory Visit: Payer: Self-pay | Admitting: Family Medicine

## 2017-12-02 DIAGNOSIS — K4090 Unilateral inguinal hernia, without obstruction or gangrene, not specified as recurrent: Secondary | ICD-10-CM

## 2017-12-06 ENCOUNTER — Ambulatory Visit
Admission: RE | Admit: 2017-12-06 | Discharge: 2017-12-06 | Disposition: A | Discharge status: Home / Self Care | Payer: Medicare HMO | Source: Ambulatory Visit | Attending: Family Medicine | Admitting: Family Medicine

## 2017-12-06 DIAGNOSIS — K4090 Unilateral inguinal hernia, without obstruction or gangrene, not specified as recurrent: Secondary | ICD-10-CM

## 2018-01-04 ENCOUNTER — Encounter: Payer: Self-pay | Admitting: Cardiovascular Disease

## 2018-01-04 ENCOUNTER — Ambulatory Visit: Payer: Medicare HMO | Admitting: Cardiovascular Disease

## 2018-01-04 VITALS — BP 150/80 | HR 48 | Ht 68.0 in | Wt 180.8 lb

## 2018-01-04 DIAGNOSIS — E785 Hyperlipidemia, unspecified: Secondary | ICD-10-CM

## 2018-01-04 LAB — LIPID PANEL
CHOL/HDL RATIO: 3 ratio (ref 0.0–5.0)
CHOLESTEROL TOTAL: 193 mg/dL (ref 100–199)
HDL: 65 mg/dL (ref >39)
LDL Calculated: 112 mg/dL — ABNORMAL HIGH (ref 0–99)
TRIGLYCERIDES: 79 mg/dL (ref 0–149)
VLDL Cholesterol Cal: 16 mg/dL (ref 5–40)

## 2018-01-04 LAB — HEPATIC FUNCTION PANEL
ALK PHOS: 68 IU/L (ref 39–117)
ALT: 23 IU/L (ref 0–44)
AST: 40 IU/L (ref 0–40)
Albumin: 4.1 g/dL (ref 3.6–4.8)
BILIRUBIN TOTAL: 0.6 mg/dL (ref 0.0–1.2)
BILIRUBIN, DIRECT: 0.14 mg/dL (ref 0.00–0.40)
Total Protein: 7.3 g/dL (ref 6.0–8.5)

## 2018-01-04 NOTE — Patient Instructions (Signed)
Medication Instructions:   NO CHANGE  Labwork:  Your physician recommends that you HAVE LAB WORK TODAY  Follow-Up:  Your physician wants you to follow-up in: ONE YEAR WITH DR BERRY You will receive a reminder letter in the mail two months in advance. If you don't receive a letter, please call our office to schedule the follow-up appointment.   If you need a refill on your cardiac medications before your next appointment, please call your pharmacy.    

## 2018-01-04 NOTE — Progress Notes (Signed)
01/04/2018 Kyle Hansen   [birth year]  [id number]  Primary Physician Kyle Found, MD Primary Cardiologist: Kyle Gess MD Kyle Hansen, [place]  HPI:  Kyle Hansen is a 62 y.o.  thin-appearing, married Caucasian male, father of 2, grandfather to 2 grandchildren who I saw  01/01/2017. He is referred to me because of positive risk factors. His wife Kyle Hansen is also a patient of mine. Apparently she was recently diagnosed with breast cancer and underwent surgery for this recently. He has a history of hyperlipidemia as well as a strong family history for heart disease with a father that had his first MI at 6. His brother had his first MI at age 85 and bypass grafting at 22. He has never had a heart attack or stroke. He denies chest pain or shortness of breath. He had a Myoview stress test performed several years ago in our office which is normal and carotid Dopplers that showed no evidence of ICA stenosis. Since I saw him a year ago he denies chest pain or shortness of breath.  He said several noncardiac cataract extraction a varicocele shoulder spur all of which are being medically addressed.     Current Meds  Medication Sig  . aspirin 81 MG tablet Take 81 mg by mouth daily.    . Coenzyme Q10 (COQ10) 200 MG CAPS Take 1 tablet by mouth daily.   . Multiple Vitamin (MULTIVITAMIN) capsule Take 1 capsule by mouth daily.    . rosuvastatin (CRESTOR) 10 MG tablet Take 1 tablet (10 mg total) by mouth 3 (three) times a week.     Allergies  Allergen Reactions  . Barbiturates Other (See Comments)    Can cause activity of porphyria  . Chloramphenicols Other (See Comments)    Can induce porphyria  . Griseofulvin Other (See Comments)    Can induce porphyria  . Librium Other (See Comments)    Can induce porphyria  . Methsuximide Other (See Comments)    Can induce porphyria  . [place] [Meprobamate] Other (See Comments)    Can induce porphyria  . Nicergoline Other (See  Comments)    Can induce porphyria  . Orinase [Tolbutamide] Other (See Comments)    Can induce porphyria  . Sulfonamide Derivatives Other (See Comments)    Can induce porphyria  . Tofranil-Pm Other (See Comments)    Can induce porphyria  . Atorvastatin Other (See Comments)    Muscle aches  . Chlordiazepoxide   . Imipramine   . Phenytoin   . Phenytoin Sodium Extended Other (See Comments)    Can induce porphyria  . Sulfa Antibiotics     Social History   Socioeconomic History  . Marital status: Married    Spouse name: Kyle Hansen  . Number of children: 2  . Years of education: Not on file  . Highest education level: Not on file  Occupational History  . Occupation: Retired  Engineer, production  . Financial resource strain: Not on file  . Food insecurity:    Worry: Not on file    Inability: Not on file  . Transportation needs:    Medical: Not on file    Non-medical: Not on file  Tobacco Use  . Smoking status: Never Smoker  . Smokeless tobacco: Current User  Substance and Sexual Activity  . Alcohol use: No  . Drug use: No  . Sexual activity: Not on file  Lifestyle  . Physical activity:    Days per week: Not  on file    Minutes per session: Not on file  . Stress: Not on file  Relationships  . Social connections:    Talks on phone: Not on file    Gets together: Not on file    Attends religious service: Not on file    Active member of club or organization: Not on file    Attends meetings of clubs or organizations: Not on file    Relationship status: Not on file  . Intimate partner violence:    Fear of current or ex partner: Not on file    Emotionally abused: Not on file    Physically abused: Not on file    Forced sexual activity: Not on file  Other Topics Concern  . Not on file  Social History Narrative  . Not on file     Review of Systems: General: negative for chills, fever, night sweats or weight changes.  Cardiovascular: negative for chest pain, dyspnea on  exertion, edema, orthopnea, palpitations, paroxysmal nocturnal dyspnea or shortness of breath Dermatological: negative for rash Respiratory: negative for cough or wheezing Urologic: negative for hematuria Abdominal: negative for nausea, vomiting, diarrhea, bright red blood per rectum, melena, or hematemesis Neurologic: negative for visual changes, syncope, or dizziness All other systems reviewed and are otherwise negative except as noted above.    Blood pressure (!) 150/80, pulse (!) 48, height 5\' 8"  (1.727 m), weight 180 lb 12.8 oz (82 kg).  General appearance: alert and no distress Neck: no adenopathy, no carotid bruit, no JVD, supple, symmetrical, trachea midline and thyroid not enlarged, symmetric, no tenderness/mass/nodules Lungs: clear to auscultation bilaterally Heart: regular rate and rhythm, S1, S2 normal, no murmur, click, rub or gallop Extremities: extremities normal, atraumatic, no cyanosis or edema Pulses: 2+ and symmetric Skin: Skin color, texture, turgor normal. No rashes or lesions Neurologic: Alert and oriented X 3, normal strength and tone. Normal symmetric reflexes. Normal coordination and gait  EKG sinus bradycardia at 48 without ST or T wave changes.  Personally reviewed this EKG.  ASSESSMENT AND PLAN:   Hyperlipidemia with target LDL less than 70 History of hyperlipidemia on statin therapy.  We will recheck a lipid and liver profile      Kyle Hansen J. Yuridia Couts MD [facility] CampusFACP,FACC,FAHA, [facility]SC[facility] 01/04/2018 8:33 AM

## 2018-01-04 NOTE — Assessment & Plan Note (Signed)
History of hyperlipidemia on statin therapy. We will recheck a lipid and liver profile 

## 2018-01-07 ENCOUNTER — Telehealth: Payer: Self-pay | Admitting: *Deleted

## 2018-01-07 DIAGNOSIS — E785 Hyperlipidemia, unspecified: Secondary | ICD-10-CM

## 2018-01-07 MED ORDER — ROSUVASTATIN CALCIUM 10 MG PO TABS: 10.0000 mg | ORAL_TABLET | ORAL | 3 refills | Status: DC

## 2018-01-07 NOTE — Telephone Encounter (Addendum)
-----   Message from Runell GessJonathan J Berry, MD sent at 01/06/2018  7:35 AM EDT ----- Not quite at goal for primary prevention.  Recommended taking 1 extra dose a week or improving heart healthy diet and rechecking.  Spoke with pt, aware of results and dr berry's recommendations. New script sent to the pharmacy and Lab orders mailed to the pt

## 2018-02-10 ENCOUNTER — Other Ambulatory Visit: Payer: Self-pay | Admitting: Cardiovascular Disease

## 2018-02-10 NOTE — Telephone Encounter (Signed)
Rx request sent to pharmacy.  

## 2018-03-11 LAB — HEPATIC FUNCTION PANEL
ALBUMIN: 3.8 g/dL (ref 3.6–4.8)
ALK PHOS: 65 IU/L (ref 39–117)
ALT: 23 IU/L (ref 0–44)
AST: 30 IU/L (ref 0–40)
BILIRUBIN TOTAL: 0.7 mg/dL (ref 0.0–1.2)
BILIRUBIN, DIRECT: 0.22 mg/dL (ref 0.00–0.40)
TOTAL PROTEIN: 6.8 g/dL (ref 6.0–8.5)

## 2018-03-11 LAB — LIPID PANEL
Chol/HDL Ratio: 2.6 ratio (ref 0.0–5.0)
Cholesterol, Total: 169 mg/dL (ref 100–199)
HDL: 64 mg/dL (ref >39)
LDL Calculated: 98 mg/dL (ref 0–99)
Triglycerides: 37 mg/dL (ref 0–149)
VLDL Cholesterol Cal: 7 mg/dL (ref 5–40)

## 2018-03-17 ENCOUNTER — Telehealth: Payer: Self-pay | Admitting: *Deleted

## 2018-03-17 ENCOUNTER — Encounter: Payer: Self-pay | Admitting: *Deleted

## 2018-03-17 NOTE — Telephone Encounter (Signed)
-----   Message from Runell GessJonathan J Berry, MD sent at 03/15/2018  4:31 PM EDT ----- Lipid profile acceptable for primary prevention.

## 2018-03-17 NOTE — Telephone Encounter (Signed)
Patient return call . Request result numbers and a copy mailed to him.  result given and letter mailed

## 2018-07-05 ENCOUNTER — Other Ambulatory Visit: Payer: Self-pay | Admitting: *Deleted

## 2018-07-05 MED ORDER — ROSUVASTATIN CALCIUM 10 MG PO TABS: 10.0000 mg | ORAL_TABLET | ORAL | 3 refills | Status: DC

## 2019-01-04 ENCOUNTER — Telehealth: Payer: Self-pay

## 2019-01-04 NOTE — Telephone Encounter (Signed)
Pt contacted and ok with appt change to 8:15am for 7/14

## 2019-01-10 ENCOUNTER — Ambulatory Visit (INDEPENDENT_AMBULATORY_CARE_PROVIDER_SITE_OTHER): Payer: Medicare Other | Admitting: Cardiovascular Disease

## 2019-01-10 ENCOUNTER — Other Ambulatory Visit: Payer: Self-pay

## 2019-01-10 ENCOUNTER — Encounter: Payer: Self-pay | Admitting: Cardiovascular Disease

## 2019-01-10 VITALS — BP 136/78 | HR 48 | Temp 97.7°F | Ht 68.0 in | Wt 170.0 lb

## 2019-01-10 DIAGNOSIS — Z008 Encounter for other general examination: Secondary | ICD-10-CM | POA: Diagnosis not present

## 2019-01-10 DIAGNOSIS — E785 Hyperlipidemia, unspecified: Secondary | ICD-10-CM

## 2019-01-10 LAB — HEPATIC FUNCTION PANEL
ALT: 16 IU/L (ref 0–44)
AST: 25 IU/L (ref 0–40)
Albumin: 4 g/dL (ref 3.8–4.8)
Alkaline Phosphatase: 64 IU/L (ref 39–117)
Bilirubin Total: 0.7 mg/dL (ref 0.0–1.2)
Bilirubin, Direct: 0.17 mg/dL (ref 0.00–0.40)
Total Protein: 6.9 g/dL (ref 6.0–8.5)

## 2019-01-10 LAB — LIPID PANEL
Chol/HDL Ratio: 2.7 ratio (ref 0.0–5.0)
Cholesterol, Total: 181 mg/dL (ref 100–199)
HDL: 68 mg/dL (ref >39)
LDL Calculated: 103 mg/dL — ABNORMAL HIGH (ref 0–99)
Triglycerides: 52 mg/dL (ref 0–149)
VLDL Cholesterol Cal: 10 mg/dL (ref 5–40)

## 2019-01-10 NOTE — Progress Notes (Signed)
01/10/2019 Kyle Hansen   02-03-1956  [id number]  Primary Physician Nickola Major, MD Primary Cardiologist: Lorretta Harp MD Lupe Carney, [place]  HPI:  Kyle Hansen is a 63 y.o.  thin-appearing, married Caucasian male, father of 2, grandfather to 2 grandchildren who I saw  01/04/2018. He is referred to me because of positive risk factors. His wife Kyle Hansen is also a patient of mine.Apparently she was recently diagnosed with breast cancer and underwent surgery for this recently.He has a history of hyperlipidemia as well as a strong family history for heart disease with a father that had his first MI at 42. His brother had his first MI at age 71 and bypass grafting at 62. He has never had a heart attack or stroke. He denies chest pain or shortness of breath. He had a Myoview stress test performed several years ago in our office which is normal and carotid Dopplers that showed no evidence of ICA stenosis.  Since I saw him 12 months ago he remained stable.  He denies chest pain or shortness of breath.  He is sheltering in place and socially distancing.   Current Meds  Medication Sig  . aspirin 81 MG tablet Take 81 mg by mouth daily.    . Coenzyme Q10 (COQ10) 200 MG CAPS Take 1 tablet by mouth daily.   . Multiple Vitamin (MULTIVITAMIN) capsule Take 1 capsule by mouth daily.    . rosuvastatin (CRESTOR) 10 MG tablet Take 1 tablet (10 mg total) by mouth 3 (three) times a week.  . [DISCONTINUED] rosuvastatin (CRESTOR) 10 MG tablet Take 1 tablet (10 mg total) by mouth 4 (four) times a week.     Allergies  Allergen Reactions  . Barbiturates Other (See Comments)    Can cause activity of porphyria  . Chloramphenicols Other (See Comments)    Can induce porphyria  . Griseofulvin Other (See Comments)    Can induce porphyria  . Librium Other (See Comments)    Can induce porphyria  . Methsuximide Other (See Comments)    Can induce porphyria  . [place] [Meprobamate] Other  (See Comments)    Can induce porphyria  . Nicergoline Other (See Comments)    Can induce porphyria  . Orinase [Tolbutamide] Other (See Comments)    Can induce porphyria  . Sulfonamide Derivatives Other (See Comments)    Can induce porphyria  . Tofranil-Pm Other (See Comments)    Can induce porphyria  . Atorvastatin Other (See Comments)    Muscle aches  . Chlordiazepoxide   . Imipramine   . Phenytoin   . Phenytoin Sodium Extended Other (See Comments)    Can induce porphyria  . Sulfa Antibiotics     Social History   Socioeconomic History  . Marital status: Married    Spouse name: Kyle Hansen  . Number of children: 2  . Years of education: Not on file  . Highest education level: Not on file  Occupational History  . Occupation: Retired  Scientific laboratory technician  . Financial resource strain: Not on file  . Food insecurity    Worry: Not on file    Inability: Not on file  . Transportation needs    Medical: Not on file    Non-medical: Not on file  Tobacco Use  . Smoking status: Never Smoker  . Smokeless tobacco: Current User  Substance and Sexual Activity  . Alcohol use: No  . Drug use: No  . Sexual activity: Not on file  Lifestyle  . Physical activity    Days per week: Not on file    Minutes per session: Not on file  . Stress: Not on file  Relationships  . Social Musicianconnections    Talks on phone: Not on file    Gets together: Not on file    Attends religious service: Not on file    Active member of club or organization: Not on file    Attends meetings of clubs or organizations: Not on file    Relationship status: Not on file  . Intimate partner violence    Fear of current or ex partner: Not on file    Emotionally abused: Not on file    Physically abused: Not on file    Forced sexual activity: Not on file  Other Topics Concern  . Not on file  Social History Narrative  . Not on file     Review of Systems: General: negative for chills, fever, night sweats or weight  changes.  Cardiovascular: negative for chest pain, dyspnea on exertion, edema, orthopnea, palpitations, paroxysmal nocturnal dyspnea or shortness of breath Dermatological: negative for rash Respiratory: negative for cough or wheezing Urologic: negative for hematuria Abdominal: negative for nausea, vomiting, diarrhea, bright red blood per rectum, melena, or hematemesis Neurologic: negative for visual changes, syncope, or dizziness All other systems reviewed and are otherwise negative except as noted above.    Blood pressure 136/78, pulse (!) 48, temperature 97.7 F (36.5 C), height 5\' 8"  (1.727 m), weight 170 lb (77.1 kg).  General appearance: alert and no distress Neck: no adenopathy, no carotid bruit, no JVD, supple, symmetrical, trachea midline and thyroid not enlarged, symmetric, no tenderness/mass/nodules Lungs: clear to auscultation bilaterally Heart: regular rate and rhythm, S1, S2 normal, no murmur, click, rub or gallop Extremities: extremities normal, atraumatic, no cyanosis or edema Pulses: 2+ and symmetric Skin: Skin color, texture, turgor normal. No rashes or lesions Neurologic: Alert and oriented X 3, normal strength and tone. Normal symmetric reflexes. Normal coordination and gait  EKG sinus bradycardia at 48 without ST or T wave changes.  I personally reviewed this EKG.  ASSESSMENT AND PLAN:   Hyperlipidemia with target LDL less than 70 History of hyperlipidemia on Crestor.  His last lipid profile performed 03/11/2018 revealed total cholesterol 169, LDL of 98 and HDL of 64.  We will recheck a lipid liver profile this morning      Runell GessJonathan J.  MD [facility] SouthFACP,FACC,FAHA, [facility] CenterFSCAI 01/10/2019 8:47 AM

## 2019-01-10 NOTE — Patient Instructions (Signed)
Medication Instructions:  Your physician recommends that you continue on your current medications as directed. Please refer to the Current Medication list given to you today.  If you need a refill on your cardiac medications before your next appointment, please call your pharmacy.   Lab work: Your physician recommends that you return for lab work TODAY: LIPID AND LIVER PANELS  If you have labs (blood work) drawn today and your tests are completely normal, you will receive your results only by: . MyChart Message (if you have MyChart) OR . A paper copy in the mail If you have any lab test that is abnormal or we need to change your treatment, we will call you to review the results.  Testing/Procedures: NONE  Follow-Up: At [facility], you and your health needs are our priority.  As part of our continuing mission to provide you with exceptional heart care, we have created designated Provider Care Teams.  These Care Teams include your primary Cardiologist (physician) and Advanced Practice Providers (APPs -  Physician Assistants and Nurse Practitioners) who all work together to provide you with the care you need, when you need it. You will need a follow up appointment in 12 months WITH DR.BERRY.  Please call our office 2 months in advance to schedule this appointment.     

## 2019-01-10 NOTE — Assessment & Plan Note (Signed)
History of hyperlipidemia on Crestor.  His last lipid profile performed 03/11/2018 revealed total cholesterol 169, LDL of 98 and HDL of 64.  We will recheck a lipid liver profile this morning

## 2019-01-11 ENCOUNTER — Other Ambulatory Visit: Payer: Self-pay

## 2019-01-11 MED ORDER — ROSUVASTATIN CALCIUM 10 MG PO TABS: 10.0000 mg | ORAL_TABLET | ORAL | 3 refills | Status: DC

## 2019-01-11 NOTE — Progress Notes (Signed)
Notes recorded by Lorretta Harp, MD on 01/10/2019 at 6:21 PM EDT  Adequate FLP for primary prevention Refill sent to mail in pharmacy

## 2019-02-08 ENCOUNTER — Other Ambulatory Visit: Payer: Self-pay | Admitting: Cardiovascular Disease

## 2019-03-28 ENCOUNTER — Other Ambulatory Visit: Payer: Self-pay | Admitting: Orthopedic Surgery

## 2019-03-28 DIAGNOSIS — M25511 Pain in right shoulder: Secondary | ICD-10-CM

## 2019-04-04 ENCOUNTER — Ambulatory Visit
Admission: RE | Admit: 2019-04-04 | Discharge: 2019-04-04 | Disposition: A | Discharge status: Home / Self Care | Payer: Medicare Other | Source: Ambulatory Visit | Attending: Orthopedic Surgery | Admitting: Orthopedic Surgery

## 2019-04-04 ENCOUNTER — Other Ambulatory Visit: Payer: Self-pay

## 2019-04-04 DIAGNOSIS — M25511 Pain in right shoulder: Secondary | ICD-10-CM

## 2019-08-16 ENCOUNTER — Telehealth: Payer: Self-pay | Admitting: Cardiovascular Disease

## 2019-08-16 NOTE — Telephone Encounter (Signed)
   [facility] Pre-operative Risk Assessment    Request for surgical clearance:  1. What type of surgery is being performed? Right Shoulder Rotator Cuff Repair  2. When is this surgery scheduled? 08/22/19  3. What type of clearance is required (medical clearance vs. Pharmacy clearance to hold med vs. Both)? Both  4. Are there any medications that need to be held prior to surgery and how long? Aspirin 81 MG, 5-7 days prior  5. Practice name and name of physician performing surgery? [facility] - Dr. Tamera Punt   6. What is your office phone number? [phone]   7.   What is your office fax number? [phone]  8.   Anesthesia type (None, local, MAC, general) ? Choice   Zara Council 08/16/2019, 4:23 PM  _________________________________________________________________   (provider comments below)

## 2019-08-17 NOTE — Telephone Encounter (Signed)
   Primary Cardiologist: Nanetta Batty, MD  Chart reviewed as part of pre-operative protocol coverage. Patient was contacted 08/17/2019 in reference to pre-operative risk assessment for pending surgery as outlined below.  Kyle Hansen was last seen on 01/10/19 by Dr. Allyson Sabal.  Since that day, Kyle Hansen has done well. He does not have a history of CAD or MI. He can complete more than 4.0 METS without angina. He may hold ASA for 5-7 days prior to surgery.   Therefore, based on ACC/AHA guidelines, the patient would be at acceptable risk for the planned procedure without further cardiovascular testing.   I will route this recommendation to the requesting party via Epic fax function and remove from pre-op pool.  Please call with questions.  Roe Rutherford Britiny Defrain, PA 08/17/2019, 1:34 PM

## 2019-11-02 ENCOUNTER — Observation Stay (HOSPITAL_COMMUNITY): Payer: Medicare Other

## 2019-11-02 ENCOUNTER — Other Ambulatory Visit: Payer: Self-pay

## 2019-11-02 ENCOUNTER — Encounter (HOSPITAL_COMMUNITY): Payer: Self-pay

## 2019-11-02 ENCOUNTER — Emergency Department (HOSPITAL_COMMUNITY): Payer: Medicare Other

## 2019-11-02 ENCOUNTER — Inpatient Hospital Stay (HOSPITAL_COMMUNITY)
Admission: EM | Admit: 2019-11-02 | Discharge: 2019-11-03 | DRG: 066 | Disposition: A | Discharge status: Home / Self Care | Payer: Medicare Other | Attending: Internal Medicine | Admitting: Internal Medicine

## 2019-11-02 ENCOUNTER — Inpatient Hospital Stay (HOSPITAL_COMMUNITY): Payer: Medicare Other

## 2019-11-02 ENCOUNTER — Other Ambulatory Visit (HOSPITAL_COMMUNITY): Payer: Medicare Other

## 2019-11-02 DIAGNOSIS — Z882 Allergy status to sulfonamides status: Secondary | ICD-10-CM | POA: Diagnosis not present

## 2019-11-02 DIAGNOSIS — I6340 Cerebral infarction due to embolism of unspecified cerebral artery: Secondary | ICD-10-CM | POA: Diagnosis present

## 2019-11-02 DIAGNOSIS — R471 Dysarthria and anarthria: Secondary | ICD-10-CM | POA: Diagnosis present

## 2019-11-02 DIAGNOSIS — Z20822 Contact with and (suspected) exposure to covid-19: Secondary | ICD-10-CM | POA: Diagnosis present

## 2019-11-02 DIAGNOSIS — Z7982 Long term (current) use of aspirin: Secondary | ICD-10-CM | POA: Diagnosis not present

## 2019-11-02 DIAGNOSIS — R29810 Facial weakness: Secondary | ICD-10-CM | POA: Diagnosis present

## 2019-11-02 DIAGNOSIS — I639 Cerebral infarction, unspecified: Secondary | ICD-10-CM | POA: Diagnosis not present

## 2019-11-02 DIAGNOSIS — Z981 Arthrodesis status: Secondary | ICD-10-CM

## 2019-11-02 DIAGNOSIS — I10 Essential (primary) hypertension: Secondary | ICD-10-CM | POA: Diagnosis present

## 2019-11-02 DIAGNOSIS — F17290 Nicotine dependence, other tobacco product, uncomplicated: Secondary | ICD-10-CM | POA: Diagnosis present

## 2019-11-02 DIAGNOSIS — Z83438 Family history of other disorder of lipoprotein metabolism and other lipidemia: Secondary | ICD-10-CM

## 2019-11-02 DIAGNOSIS — R17 Unspecified jaundice: Secondary | ICD-10-CM | POA: Diagnosis not present

## 2019-11-02 DIAGNOSIS — R29702 NIHSS score 2: Secondary | ICD-10-CM | POA: Diagnosis present

## 2019-11-02 DIAGNOSIS — Z8249 Family history of ischemic heart disease and other diseases of the circulatory system: Secondary | ICD-10-CM | POA: Diagnosis not present

## 2019-11-02 DIAGNOSIS — I2510 Atherosclerotic heart disease of native coronary artery without angina pectoris: Secondary | ICD-10-CM | POA: Diagnosis present

## 2019-11-02 DIAGNOSIS — I6389 Other cerebral infarction: Secondary | ICD-10-CM | POA: Diagnosis not present

## 2019-11-02 DIAGNOSIS — I779 Disorder of arteries and arterioles, unspecified: Secondary | ICD-10-CM | POA: Diagnosis not present

## 2019-11-02 DIAGNOSIS — E785 Hyperlipidemia, unspecified: Secondary | ICD-10-CM

## 2019-11-02 DIAGNOSIS — I63411 Cerebral infarction due to embolism of right middle cerebral artery: Secondary | ICD-10-CM | POA: Diagnosis not present

## 2019-11-02 DIAGNOSIS — G459 Transient cerebral ischemic attack, unspecified: Secondary | ICD-10-CM | POA: Diagnosis present

## 2019-11-02 DIAGNOSIS — Z888 Allergy status to other drugs, medicaments and biological substances status: Secondary | ICD-10-CM | POA: Diagnosis not present

## 2019-11-02 DIAGNOSIS — R001 Bradycardia, unspecified: Secondary | ICD-10-CM | POA: Diagnosis present

## 2019-11-02 LAB — DIFFERENTIAL
Abs Immature Granulocytes: 0.01 10*3/uL (ref 0.00–0.07)
Basophils Absolute: 0 10*3/uL (ref 0.0–0.1)
Basophils Relative: 1 %
Eosinophils Absolute: 0.1 10*3/uL (ref 0.0–0.5)
Eosinophils Relative: 2 %
Immature Granulocytes: 0 %
Lymphocytes Relative: 37 %
Lymphs Abs: 1.6 10*3/uL (ref 0.7–4.0)
Monocytes Absolute: 0.6 10*3/uL (ref 0.1–1.0)
Monocytes Relative: 13 %
Neutro Abs: 2.1 10*3/uL (ref 1.7–7.7)
Neutrophils Relative %: 47 %

## 2019-11-02 LAB — CBC
HCT: 41.7 % (ref 39.0–52.0)
Hemoglobin: 14.4 g/dL (ref 13.0–17.0)
MCH: 32.4 pg (ref 26.0–34.0)
MCHC: 34.5 g/dL (ref 30.0–36.0)
MCV: 93.7 fL (ref 80.0–100.0)
Platelets: 174 10*3/uL (ref 150–400)
RBC: 4.45 MIL/uL (ref 4.22–5.81)
RDW: 12.9 % (ref 11.5–15.5)
WBC: 4.4 10*3/uL (ref 4.0–10.5)
nRBC: 0 % (ref 0.0–0.2)

## 2019-11-02 LAB — LIPID PANEL
Cholesterol: 207 mg/dL — ABNORMAL HIGH (ref 0–200)
HDL: 64 mg/dL (ref >40)
LDL Cholesterol: 130 mg/dL — ABNORMAL HIGH (ref 0–99)
Total CHOL/HDL Ratio: 3.2 RATIO
Triglycerides: 65 mg/dL (ref <150)
VLDL: 13 mg/dL (ref 0–40)

## 2019-11-02 LAB — PROTIME-INR
INR: 1.1 (ref 0.8–1.2)
Prothrombin Time: 13.6 seconds (ref 11.4–15.2)

## 2019-11-02 LAB — COMPREHENSIVE METABOLIC PANEL
ALT: 17 U/L (ref 0–44)
AST: 29 U/L (ref 15–41)
Albumin: 3.4 g/dL — ABNORMAL LOW (ref 3.5–5.0)
Alkaline Phosphatase: 59 U/L (ref 38–126)
Anion gap: 10 (ref 5–15)
BUN: 20 mg/dL (ref 8–23)
CO2: 24 mmol/L (ref 22–32)
Calcium: 9.3 mg/dL (ref 8.9–10.3)
Chloride: 105 mmol/L (ref 98–111)
Creatinine, Ser: 1.11 mg/dL (ref 0.61–1.24)
GFR calc Af Amer: >60 mL/min (ref >60)
GFR calc non Af Amer: >60 mL/min (ref >60)
Glucose, Bld: 108 mg/dL — ABNORMAL HIGH (ref 70–99)
Potassium: 3.8 mmol/L (ref 3.5–5.1)
Sodium: 139 mmol/L (ref 135–145)
Total Bilirubin: 1.4 mg/dL — ABNORMAL HIGH (ref 0.3–1.2)
Total Protein: 7.1 g/dL (ref 6.5–8.1)

## 2019-11-02 LAB — I-STAT CHEM 8, ED
BUN: 23 mg/dL (ref 8–23)
Calcium, Ion: 1.23 mmol/L (ref 1.15–1.40)
Chloride: 105 mmol/L (ref 98–111)
Creatinine, Ser: 1 mg/dL (ref 0.61–1.24)
Glucose, Bld: 99 mg/dL (ref 70–99)
HCT: 41 % (ref 39.0–52.0)
Hemoglobin: 13.9 g/dL (ref 13.0–17.0)
Potassium: 3.7 mmol/L (ref 3.5–5.1)
Sodium: 141 mmol/L (ref 135–145)
TCO2: 28 mmol/L (ref 22–32)

## 2019-11-02 LAB — HEMOGLOBIN A1C
Hgb A1c MFr Bld: 4.7 % — ABNORMAL LOW (ref 4.8–5.6)
Mean Plasma Glucose: 88.19 mg/dL

## 2019-11-02 LAB — HIV ANTIBODY (ROUTINE TESTING W REFLEX): HIV Screen 4th Generation wRfx: NONREACTIVE

## 2019-11-02 LAB — URINALYSIS, ROUTINE W REFLEX MICROSCOPIC
Bilirubin Urine: NEGATIVE
Glucose, UA: NEGATIVE mg/dL
Hgb urine dipstick: NEGATIVE
Ketones, ur: NEGATIVE mg/dL
Leukocytes,Ua: NEGATIVE
Nitrite: NEGATIVE
Protein, ur: NEGATIVE mg/dL
Specific Gravity, Urine: 1.008 (ref 1.005–1.030)
pH: 7 (ref 5.0–8.0)

## 2019-11-02 LAB — RAPID URINE DRUG SCREEN, HOSP PERFORMED
Amphetamines: NOT DETECTED
Barbiturates: NOT DETECTED
Benzodiazepines: NOT DETECTED
Cocaine: NOT DETECTED
Opiates: NOT DETECTED
Tetrahydrocannabinol: NOT DETECTED

## 2019-11-02 LAB — RESPIRATORY PANEL BY RT PCR (FLU A&B, COVID)
Influenza A by PCR: NEGATIVE
Influenza B by PCR: NEGATIVE
SARS Coronavirus 2 by RT PCR: NEGATIVE

## 2019-11-02 LAB — APTT: aPTT: 31 seconds (ref 24–36)

## 2019-11-02 LAB — ETHANOL: Alcohol, Ethyl (B): <10 mg/dL (ref <10)

## 2019-11-02 MED ORDER — ENOXAPARIN SODIUM 40 MG/0.4ML ~~LOC~~ SOLN
40.0000 mg | SUBCUTANEOUS | Status: DC
  Administered 2019-11-02 – 2019-11-03 (×2): 40 mg via SUBCUTANEOUS
  Filled 2019-11-02 (×2): qty 0.4

## 2019-11-02 MED ORDER — STROKE: EARLY STAGES OF RECOVERY BOOK: Freq: Once | Status: AC

## 2019-11-02 MED ORDER — SODIUM CHLORIDE 0.9 % IV SOLN: Freq: Once | INTRAVENOUS | Status: AC

## 2019-11-02 MED ORDER — ROSUVASTATIN CALCIUM 20 MG PO TABS
40.0000 mg | ORAL_TABLET | Freq: Every day | ORAL | Status: DC
  Administered 2019-11-02 – 2019-11-03 (×2): 40 mg via ORAL
  Filled 2019-11-02 (×2): qty 2

## 2019-11-02 MED ORDER — ACETAMINOPHEN 325 MG PO TABS: 650.0000 mg | ORAL_TABLET | ORAL | Status: DC | PRN

## 2019-11-02 MED ORDER — SENNOSIDES-DOCUSATE SODIUM 8.6-50 MG PO TABS: 1.0000 | ORAL_TABLET | Freq: Every evening | ORAL | Status: DC | PRN

## 2019-11-02 MED ORDER — ROSUVASTATIN CALCIUM 5 MG PO TABS: 10.0000 mg | ORAL_TABLET | ORAL | Status: DC

## 2019-11-02 MED ORDER — ASPIRIN 81 MG PO CHEW
648.0000 mg | CHEWABLE_TABLET | Freq: Once | ORAL | Status: AC
  Administered 2019-11-02: 11:00:00 648 mg via ORAL
  Filled 2019-11-02: qty 8

## 2019-11-02 MED ORDER — LORAZEPAM 2 MG/ML IJ SOLN
1.0000 mg | Freq: Once | INTRAMUSCULAR | Status: AC
  Administered 2019-11-02: 1 mg via INTRAVENOUS
  Filled 2019-11-02: qty 1

## 2019-11-02 MED ORDER — ACETAMINOPHEN 650 MG RE SUPP: 650.0000 mg | RECTAL | Status: DC | PRN

## 2019-11-02 MED ORDER — ACETAMINOPHEN 160 MG/5ML PO SOLN: 650.0000 mg | ORAL | Status: DC | PRN

## 2019-11-02 MED ORDER — IOHEXOL 350 MG/ML SOLN
75.0000 mL | Freq: Once | INTRAVENOUS | Status: AC | PRN
  Administered 2019-11-02: 75 mL via INTRAVENOUS

## 2019-11-02 NOTE — ED Notes (Signed)
In by EMS with reported last known normal at 0900.  Pt was ambulating when Ems arrived and c/o sudden onset of tingling to R hand extending into R arm.  Pt noticed he had slurred speech and wife called 911.  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  Pt alert and oriented with no pain responses, able to move from CT table to stretcher (I).  No hx of previous sickness, cough N/V/D or CP.

## 2019-11-02 NOTE — ED Provider Notes (Signed)
[facility] EMERGENCY DEPARTMENT Provider Note   CSN: [id number] Arrival date & time: 11/02/19  9983  An emergency department physician performed an initial assessment on this suspected stroke patient at 0955.  History No chief complaint on file.   Kyle Hansen is a 64 y.o. male.  HPI 64 year old male presents with acute left-sided tingling and left facial weakness.  Started around 9 AM when he was getting ready to go to therapy.  First noticed tingling in his left hand and then noticed it in his face and had a facial droop and trouble speaking.  It was also going up his arm.  No leg symptoms.  Never felt like his arm was weak.  Called as a code stroke and brought in via EMS.  Past Medical History:  Diagnosis Date  . Blood dyscrasia   . Family history of premature CAD   . Hyperlipidemia LDL goal < 70   . 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 Normal cardiac stress test 12/17/2010   though there was a marked hypertensive response   . 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 TIA (transient ischemic attack) 11/02/2019  . Cough 04/08/2013  . Family history of premature CAD   . Hyperlipidemia with target LDL less than 70   . Mild carotid artery disease ([place]) 11/28/2010    Past Surgical History:  Procedure Laterality Date  . COLONOSCOPY    . FOOT ARTHRODESIS  2009   lt  . HERNIA REPAIR  1993  . MASS EXCISION  06/16/2011   Procedure: EXCISION MASS;  Surgeon: Cammie Sickle., MD;  Location: [facility];  Service: Orthopedics;  Laterality: Left;  left elbow, and posterior interosseus nerve decompression with injection of right olecranon bursa  . ORIF CALCANEOUS FRACTURE  2008   lt  . PIP JOINT FUSION  2006   lt  . SHOULDER ARTHROSCOPY  2008   lt       Family History  Problem Relation Age of Onset  . Heart attack Father   . Heart  disease Father   . Hyperlipidemia Father   . Heart attack Brother   . Heart disease Brother   . Hyperlipidemia Brother     Social History   Tobacco Use  . Smoking status: Never Smoker  . Smokeless tobacco: Current User  Substance Use Topics  . Alcohol use: No  . Drug use: No    Home Medications Prior to Admission medications   Medication Sig Start Date End Date Taking? Authorizing Provider  aspirin 81 MG tablet Take 81 mg by mouth daily.     Yes [provider]  Coenzyme Q10 (COQ10) 200 MG CAPS Take 1 tablet by mouth daily.    Yes [provider]  levocetirizine (XYZAL) 5 MG tablet Take 5 mg by mouth daily as needed for allergies (evening).   Yes [provider]  Multiple Vitamin (MULTIVITAMIN) capsule Take 1 capsule by mouth daily.     Yes [provider]  rosuvastatin (CRESTOR) 10 MG tablet TAKE 1 TABLET BY MOUTH 3  TIMES WEEKLY Patient taking differently: Take 10 mg by mouth 3 (three) times a week. Kyle Hansen, Friday 02/09/19  Yes Lorretta Harp, MD    Allergies    Barbiturates, Chloramphenicols, Griseofulvin, Librium, Methsuximide, [place] [meprobamate], Nicergoline, Orinase [tolbutamide], Sulfonamide derivatives, Tofranil-pm, Atorvastatin, Chlordiazepoxide,  Imipramine, Phenytoin, Phenytoin sodium extended, and Sulfa antibiotics  Review of Systems   Review of Systems  Neurological: Positive for speech difficulty and numbness. Negative for weakness.  All other systems reviewed and are negative.   Physical Exam Updated Vital Signs BP (!) 157/76   Pulse (!) 50   Temp (!) 97.5 F (36.4 C)   Resp 13   Ht 5\' 8"  (1.727 m)   Wt 75.2 kg   SpO2 100%   BMI 25.21 kg/m   Physical Exam Vitals and nursing note reviewed.  Constitutional:      General: He is not in acute distress.    Appearance: He is well-developed. He is not ill-appearing or diaphoretic.  HENT:     Head: Normocephalic and atraumatic.     Right Ear: External ear normal.      Left Ear: External ear normal.     Nose: Nose normal.  Eyes:     General:        Right eye: No discharge.        Left eye: No discharge.     Extraocular Movements: Extraocular movements intact.     Pupils: Pupils are equal, round, and reactive to light.  Cardiovascular:     Rate and Rhythm: Normal rate and regular rhythm.     Heart sounds: Normal heart sounds.  Pulmonary:     Effort: Pulmonary effort is normal.     Breath sounds: Normal breath sounds.  Abdominal:     Palpations: Abdomen is soft.     Tenderness: There is no abdominal tenderness.  Musculoskeletal:     Cervical back: Neck supple.  Skin:    General: Skin is warm and dry.  Neurological:     Mental Status: He is alert.     Comments: CN 3-12 grossly intact save for slight facial droop on left. 5/5 strength in all 4 extremities. Grossly normal sensation.  Psychiatric:        Mood and Affect: Mood is not anxious.     ED Results / Procedures / Treatments   Labs (all labs ordered are listed, but only abnormal results are displayed) Labs Reviewed  COMPREHENSIVE METABOLIC PANEL - Abnormal; Notable for the following components:      Result Value   Glucose, Bld 108 (*)    Albumin 3.4 (*)    Total Bilirubin 1.4 (*)    All other components within normal limits  RESPIRATORY PANEL BY RT PCR (FLU A&B, COVID)  ETHANOL  PROTIME-INR  APTT  CBC  DIFFERENTIAL  RAPID URINE DRUG SCREEN, HOSP PERFORMED  URINALYSIS, ROUTINE W REFLEX MICROSCOPIC  HIV ANTIBODY (ROUTINE TESTING W REFLEX)  HEMOGLOBIN A1C  LIPID PANEL  I-STAT CHEM 8, ED    EKG EKG Interpretation  Date/Time:  Thursday Nov 02 2019 10:19:22 EDT Ventricular Rate:  50 PR Interval:    QRS Duration: 106 QT Interval:  430 QTC Calculation: 393 R Axis:   64 Text Interpretation: Sinus bradycardia no acute ST/T changes similar to Jan 2008 Confirmed by Feb 2008 [phone]) on 11/02/2019 10:57:46 AM   Radiology CT HEAD CODE STROKE WO CONTRAST  Result Date:  11/02/2019 CLINICAL DATA:  Code stroke.  Slurred speech and facial droop EXAM: CT HEAD WITHOUT CONTRAST TECHNIQUE: Contiguous axial images were obtained from the base of the skull through the vertex without intravenous contrast. COMPARISON:  None. FINDINGS: Brain: No evidence of acute infarction, hemorrhage, hydrocephalus, extra-axial collection or mass lesion/mass effect. Vascular: Negative for hyperdense vessel Skull: Negative Sinuses/Orbits: Paranasal  sinuses clear. Bilateral cataract extraction. Other: None ASPECTS (Alberta Stroke Program Early CT Score) - Ganglionic level infarction (caudate, lentiform nuclei, internal capsule, insula, M1-M3 cortex): 7 - Supraganglionic infarction (M4-M6 cortex): 3 Total score (0-10 with 10 being normal): 10 IMPRESSION: 1. No acute abnormality 2. ASPECTS is 10 Electronically Signed   By: Marlan Palau M.D.   On: 11/02/2019 10:14    Procedures Procedures (including critical care time)  Medications Ordered in ED Medications  LORazepam (ATIVAN) injection 1 mg (has no administration in time range)  rosuvastatin (CRESTOR) tablet 10 mg (has no administration in time range)   stroke: mapping our early stages of recovery book (has no administration in time range)  0.9 %  sodium chloride infusion (has no administration in time range)  acetaminophen (TYLENOL) tablet 650 mg (has no administration in time range)    Or  acetaminophen (TYLENOL) 160 MG/5ML solution 650 mg (has no administration in time range)    Or  acetaminophen (TYLENOL) suppository 650 mg (has no administration in time range)  senna-docusate (Senokot-S) tablet 1 tablet (has no administration in time range)  enoxaparin (LOVENOX) injection 40 mg (has no administration in time range)  aspirin chewable tablet 648 mg (648 mg Oral Given 11/02/19 1059)    ED Course  I have reviewed the triage vital signs and the nursing notes.  Pertinent labs & imaging results that were available during my care of the  patient were reviewed by me and considered in my medical decision making (see chart for details).    MDM Rules/Calculators/A&P                      Patient's slurred speech has improved since getting head CT.  He had been offered TPA by neurology but after risk/benefit discussion he declines.  Symptoms are already improving.  At this point he is technically still a stroke but if his symptoms resolve soon will be qualified as a TIA.  Either way needs stroke work-up.  Given 650 mg chewable aspirin per neurology.  Discussed with Dr. Katrinka Blazing for admission. Final Clinical Impression(s) / ED Diagnoses Final diagnoses:  TIA (transient ischemic attack)    Rx / DC Orders ED Discharge Orders    None       Pricilla Loveless, MD 11/02/19 1127

## 2019-11-02 NOTE — Progress Notes (Signed)
OT Cancellation Note  Patient Details Name: Kyle Hansen MRN: [id number] DOB: [birth year]   Cancelled Treatment:    Reason Eval/Treat Not Completed: OT screened, no needs identified, will sign off(Pt is at baseline for BADLs with symptoms resolved. OT will sign off. Please re consult if changes arise)  Dalphine Handing, MSOT, OTR/L Acute Rehabilitation Services [facility] Office Number: [phone] Pager: [phone]  Dalphine Handing 11/02/2019, 5:53 PM

## 2019-11-02 NOTE — Plan of Care (Signed)
  Problem: Education: Goal: Knowledge of disease or condition will improve Outcome: Progressing Goal: Knowledge of secondary prevention will improve Outcome: Progressing Goal: Knowledge of patient specific risk factors addressed and post discharge goals established will improve Outcome: Progressing   Problem: Coping: Goal: Will verbalize positive feelings about self Outcome: Progressing   Problem: Self-Care: Goal: Ability to participate in self-care as condition permits will improve Outcome: Progressing   Problem: Nutrition: Goal: Risk of aspiration will decrease Outcome: Progressing Goal: Dietary intake will improve Outcome: Progressing

## 2019-11-02 NOTE — H&P (Addendum)
History and Physical    Kyle Hansen VEL:[id number] DOB: 1956/05/27 DOA: 11/02/2019  Referring MD/NP/PA: Sherlie Ban PCP: Gwenlyn Found, MD  Patient coming from: Home  Chief Complaint: Left arm tingling, left facial droop, and slurred speech  I have personally briefly reviewed patient's old medical records in [facility]   HPI: Kyle Hansen is a 64 y.o. male with medical history significant of HLD, CAD, carotid artery disease, intermittent porphyria presents with complaints of left arm tingling, left-sided facial droop, and slurred speech this morning while trying to get ready to go to therapy room.  Patient was last known normal at 9 AM.  He was in the bathroom getting dressed when he felt a tingling sensation in his first 2 fingers of his left hand that started to travel up his arm.  He checked the mirror and did not notice any facial droop at that time.  By the time he went to go get his wife she note him d to be acutely slurring with new left-sided facial droop.  EMS was called immediately.  For last couple days he had been complaining of dizziness with standing.  Symptoms usually resolved after standing still for a few seconds.  They question if that allergy medicine that he has been on for the last month was the cause and he had stopped it.  Denies having any vision changes, palpitations, chest pain, nausea, vomiting, diarrhea, cough, shortness of breath symptoms.    ED Course: Patient presented to the emergency department as a code stroke.  Initial CT scan of the brain showed no acute abnormality.  Dr. Otelia Limes of neurology was consulted.  TPA was offered to the patient, but he declined due to gradual improvement in symptoms.  Vital signs revealed pulse of 49-60, respirations 13-29, and blood pressure 143/71-148/94. Labs were unremarkable.  Patient was given 650 mg of aspirin per neurology recommendation for TRH called to admit for completion of stroke  work-up.  Review of Systems  Constitutional: Negative for fever.  HENT: Negative for ear discharge and sinus pain.   Eyes: Negative for double vision and photophobia.  Respiratory: Negative for shortness of breath.   Cardiovascular: Negative for chest pain and leg swelling.  Gastrointestinal: Negative for abdominal pain, nausea and vomiting.  Genitourinary: Negative for dysuria and hematuria.  Musculoskeletal: Positive for joint pain. Negative for falls.  Skin: Negative for itching and rash.  Neurological: Positive for speech change and focal weakness.  Psychiatric/Behavioral: Negative for substance abuse.    Past Medical History:  Diagnosis Date  . Blood dyscrasia   . Family history of premature CAD   . Hyperlipidemia LDL goal < 70   . Mild carotid artery disease ([facility]) 11/2010   bilateral  . Normal cardiac stress test 12/17/2010   though there was a marked hypertensive response   . Porphyria ([facility])    multiple drugs to avoidsome meds are safe    Past Surgical History:  Procedure Laterality Date  . COLONOSCOPY    . FOOT ARTHRODESIS  2009   lt  . HERNIA REPAIR  1993  . MASS EXCISION  06/16/2011   Procedure: EXCISION MASS;  Surgeon: Wyn Forster., MD;  Location: [facility];  Service: Orthopedics;  Laterality: Left;  left elbow, and posterior interosseus nerve decompression with injection of right olecranon bursa  . ORIF CALCANEOUS FRACTURE  2008   lt  . PIP JOINT FUSION  2006   lt  . SHOULDER ARTHROSCOPY  2008   lt     reports that he has never smoked. He uses smokeless tobacco. He reports that he does not drink alcohol or use drugs.  Allergies  Allergen Reactions  . Barbiturates Other (See Comments)    Can cause activity of porphyria  . Chloramphenicols Other (See Comments)    Can induce porphyria  . Griseofulvin Other (See Comments)    Can induce porphyria  . Librium Other (See Comments)    Can induce porphyria  . Methsuximide Other (See  Comments)    Can induce porphyria  . [place] [Meprobamate] Other (See Comments)    Can induce porphyria  . Nicergoline Other (See Comments)    Can induce porphyria  . Orinase [Tolbutamide] Other (See Comments)    Can induce porphyria  . Sulfonamide Derivatives Other (See Comments)    Can induce porphyria  . Tofranil-Pm Other (See Comments)    Can induce porphyria  . Atorvastatin Other (See Comments)    Muscle aches  . Chlordiazepoxide   . Imipramine   . Phenytoin   . Phenytoin Sodium Extended Other (See Comments)    Can induce porphyria  . Sulfa Antibiotics     Family History  Problem Relation Age of Onset  . Heart attack Father   . Heart disease Father   . Hyperlipidemia Father   . Heart attack Brother   . Heart disease Brother   . Hyperlipidemia Brother     Prior to Admission medications   Medication Sig Start Date End Date Taking? Authorizing Provider  aspirin 81 MG tablet Take 81 mg by mouth daily.     Yes [provider]  Coenzyme Q10 (COQ10) 200 MG CAPS Take 1 tablet by mouth daily.    Yes [provider]  levocetirizine (XYZAL) 5 MG tablet Take 5 mg by mouth daily as needed for allergies (evening).   Yes [provider]  Multiple Vitamin (MULTIVITAMIN) capsule Take 1 capsule by mouth daily.     Yes [provider]  rosuvastatin (CRESTOR) 10 MG tablet TAKE 1 TABLET BY MOUTH 3  TIMES WEEKLY Patient taking differently: Take 10 mg by mouth 3 (three) times a week. Jory Sims, Friday 02/09/19  Yes Lorretta Harp, MD    Physical Exam:  Constitutional: Older male who appears to be in no acute distress at this time Vitals:   11/02/19 1032 11/02/19 1035 11/02/19 1037 11/02/19 1039  BP: (!) 143/79  (!) 143/71   Pulse: (!) 55 (!) 51 (!) 54   Resp: 16 16 13    Temp: (!) 97.5 F (36.4 C)     SpO2: 100% 100% 100% 100%  Weight:      Height:       Eyes: PERRL, lids and conjunctivae normal ENMT: Mucous membranes are moist. Posterior  pharynx clear of any exudate or lesions. Normal dentition.  Neck: normal, supple, no masses, no thyromegaly Respiratory: clear to auscultation bilaterally, no wheezing, no crackles. Normal respiratory effort. No accessory muscle use.  Cardiovascular: Bradycardic, no murmurs / rubs / gallops. No extremity edema. 2+ pedal pulses. No carotid bruits.  Abdomen: no tenderness, no masses palpated. No hepatosplenomegaly. Bowel sounds positive.  Musculoskeletal: no clubbing / cyanosis. No joint deformity upper and lower extremities. Good ROM, no contractures. Normal muscle tone.  Skin: no rashes, lesions, ulcers. No induration Neurologic: CN 2-12 grossly intact. Sensation intact, DTR normal. Strength 5/5 in all 4.  Patient speech is still psychiatric: Normal judgment and insight. Alert and oriented x  3. Normal mood.     Labs on Admission: I have personally reviewed following labs and imaging studies  CBC: Recent Labs  Lab 11/02/19 0955 11/02/19 0958  WBC 4.4  --   NEUTROABS 2.1  --   HGB 14.4 13.9  HCT 41.7 41.0  MCV 93.7  --   PLT 174  --    Basic Metabolic Panel: Recent Labs  Lab 11/02/19 0955 11/02/19 0958  NA 139 141  K 3.8 3.7  CL 105 105  CO2 24  --   GLUCOSE 108* 99  BUN 20 23  CREATININE 1.11 1.00  CALCIUM 9.3  --    GFR: Estimated Creatinine Clearance: 73.2 mL/min (by C-G formula based on SCr of 1 mg/dL). Liver Function Tests: Recent Labs  Lab 11/02/19 0955  AST 29  ALT 17  ALKPHOS 59  BILITOT 1.4*  PROT 7.1  ALBUMIN 3.4*   No results for input(s): LIPASE, AMYLASE in the last 168 hours. No results for input(s): AMMONIA in the last 168 hours. Coagulation Profile: Recent Labs  Lab 11/02/19 0955  INR 1.1   Cardiac Enzymes: No results for input(s): CKTOTAL, CKMB, CKMBINDEX, TROPONINI in the last 168 hours. BNP (last 3 results) No results for input(s): PROBNP in the last 8760 hours. HbA1C: No results for input(s): HGBA1C in the last 72 hours. CBG: No  results for input(s): GLUCAP in the last 168 hours. Lipid Profile: No results for input(s): CHOL, HDL, LDLCALC, TRIG, CHOLHDL, LDLDIRECT in the last 72 hours. Thyroid Function Tests: No results for input(s): TSH, T4TOTAL, FREET4, T3FREE, THYROIDAB in the last 72 hours. Anemia Panel: No results for input(s): VITAMINB12, FOLATE, FERRITIN, TIBC, IRON, RETICCTPCT in the last 72 hours. Urine analysis: No results found for: COLORURINE, APPEARANCEUR, LABSPEC, PHURINE, GLUCOSEU, HGBUR, BILIRUBINUR, KETONESUR, PROTEINUR, UROBILINOGEN, NITRITE, LEUKOCYTESUR Sepsis Labs: No results found for this or any previous visit (from the past 240 hour(s)).   Radiological Exams on Admission: CT HEAD CODE STROKE WO CONTRAST  Result Date: 11/02/2019 CLINICAL DATA:  Code stroke.  Slurred speech and facial droop EXAM: CT HEAD WITHOUT CONTRAST TECHNIQUE: Contiguous axial images were obtained from the base of the skull through the vertex without intravenous contrast. COMPARISON:  None. FINDINGS: Brain: No evidence of acute infarction, hemorrhage, hydrocephalus, extra-axial collection or mass lesion/mass effect. Vascular: Negative for hyperdense vessel Skull: Negative Sinuses/Orbits: Paranasal sinuses clear. Bilateral cataract extraction. Other: None ASPECTS (Alberta Stroke Program Early CT Score) - Ganglionic level infarction (caudate, lentiform nuclei, internal capsule, insula, M1-M3 cortex): 7 - Supraganglionic infarction (M4-M6 cortex): 3 Total score (0-10 with 10 being normal): 10 IMPRESSION: 1. No acute abnormality 2. ASPECTS is 10 Electronically Signed   By: Marlan Palau M.D.   On: 11/02/2019 10:14    EKG: Independently reviewed.  Sinus bradycardia at 50 bpm.  Assessment/Plan CVA: Acute.  Patient presented with complaints of numbness tingling in his left hand and subsequently developed facial droop and slurred speech.  Initial CT negative for any signs of acute stroke.  TPA was offered due to patient symptoms, but  patient declined.  Neurology recommended giving patient 625 mg of aspirin.  MRI was positive for acute stroke posterior right frontal lobe white matter, tail of right caudate nucleus and extending inferiorly within the right internal capsule-Admit to telemetry bed -Stroke order set initiated -Neuro checks -Check CT angiogram of the head neck -Allow for permissive hypertension at this time -PT/OT/Speech to eval and treat -Check echocardiogram -Check Hemoglobin A1c and lipid panel in a.m. -ASA -  Appreciate neurology consultative services, will follow-up   Sinus bradycardia: Chronic.  EKG revealed sinus bradycardia rate of 50 bpm. -Follow-up telemetry overnight  Acute intermittent porphyria -Check ferritin levels in a.m.  Carotid artery disease: Patient previously noted mild carotid artery disease back in 2013. -Follow-up CT angiogram of the head and neck  Hyperlipidemia: Patient on atorvastatin 10 mg 3 times weekly. -Increase atorvastatin to 40 mg daily  Elevated total bilirubin: Acute.  Total bilirubin mildly elevated at 1.4. -Continue to monitor  DVT prophylaxis: Lovenox Code Status: Full Family Communication: Family updated Disposition Plan: Possible discharge home in 1 to 2 days Consults called: Neurology Admission status: Inpatient  Clydie Braun MD [facility] Pager [phone]   If 7PM-7AM, please contact night-coverage [email or web address] Password [email or web address]  11/02/2019, 11:07 AM

## 2019-11-02 NOTE — ED Notes (Signed)
To ED room and and wife arrived.  Pt slurred speech resolved and WNL at this time. Continues to have L side facial droop slightly, which is resolving.  POC updated.

## 2019-11-02 NOTE — Code Documentation (Signed)
64 yo male coming from home where he was with his wife this morning. LKW at 0900 when he had a sudden onset of left arm tingling and left face numbness. He called his wife in the room and she noted a facial droop. EMS was called and activated a Code Stroke.   The neurologist met patient upon arrival to the ED. Pt taken to CT and CT Head negative for hemorrhage per MD. Initial NIHSS 2 due to left facial droop and slurred speech with symptoms continuing to resolve. Decision made with patient to not proceed with tPA due to too mild to treat.   Patient taken back to the ED and placed on cardiac monitor. NIHSS 1 due to slight left facial droop. Slurred speech resolved. Patient remains in the tPA window until 1330. Q15 VS and Q30 mNIHSS until this time. Repage Code Stroke if patient's symptoms worsen.

## 2019-11-02 NOTE — ED Notes (Signed)
Pt returned from MRI at this time. Placed pt back on monitor, pt placed in position of comfort and advised of wait status.   

## 2019-11-02 NOTE — Consult Note (Signed)
Referring Physician: Dr. Criss Alvine    Chief Complaint: Acute onset of slurred speech with facial droop  HPI: Kyle Hansen is an 64 y.o. male who presented acutely to the ED via EMS for evaluation of sudden onset of tingling to left hand that extended into his left arm. TOSO is the same as LKN: 0900. The patient also had noticed left facial tingling, left facial droop and slurred speech. His wife called 911. On arrival to the ED he was fully alert and oriented.   In the ED he states his arm only had tingling, with no weakness. Also with no leg symptoms.   He has a history of acute intermittent porphyria.   LSN: 9 AM tPA Given: No: Patient declined tPA after full discussion of risks versus benefits.  NIHSS: 2 (slurred speech and left facial droop)   Past Medical History:  Diagnosis Date  . Blood dyscrasia   . Family history of premature CAD   . Hyperlipidemia LDL goal < 70   . Mild carotid artery disease (HCC) 11/2010   bilateral  . Normal cardiac stress test 12/17/2010   though there was a marked hypertensive response   . Porphyria (HCC)    multiple drugs to avoidsome meds are safe    Past Surgical History:  Procedure Laterality Date  . COLONOSCOPY    . FOOT ARTHRODESIS  2009   lt  . HERNIA REPAIR  1993  . MASS EXCISION  06/16/2011   Procedure: EXCISION MASS;  Surgeon: Wyn Forster., MD;  Location: [facility];  Service: Orthopedics;  Laterality: Left;  left elbow, and posterior interosseus nerve decompression with injection of right olecranon bursa  . ORIF CALCANEOUS FRACTURE  2008   lt  . PIP JOINT FUSION  2006   lt  . SHOULDER ARTHROSCOPY  2008   lt    Family History  Problem Relation Age of Onset  . Heart attack Father   . Heart disease Father   . Hyperlipidemia Father   . Heart attack Brother   . Heart disease Brother   . Hyperlipidemia Brother    Social History:  reports that he has never smoked. He uses smokeless tobacco. He reports  that he does not drink alcohol or use drugs.  Allergies:  Allergies  Allergen Reactions  . Barbiturates Other (See Comments)    Can cause activity of porphyria  . Chloramphenicols Other (See Comments)    Can induce porphyria  . Griseofulvin Other (See Comments)    Can induce porphyria  . Librium Other (See Comments)    Can induce porphyria  . Methsuximide Other (See Comments)    Can induce porphyria  . [place] [Meprobamate] Other (See Comments)    Can induce porphyria  . Nicergoline Other (See Comments)    Can induce porphyria  . Orinase [Tolbutamide] Other (See Comments)    Can induce porphyria  . Sulfonamide Derivatives Other (See Comments)    Can induce porphyria  . Tofranil-Pm Other (See Comments)    Can induce porphyria  . Atorvastatin Other (See Comments)    Muscle aches  . Chlordiazepoxide   . Imipramine   . Phenytoin   . Phenytoin Sodium Extended Other (See Comments)    Can induce porphyria  . Sulfa Antibiotics     Home Medications:  No current facility-administered medications on file prior to encounter.   Current Outpatient Medications on File Prior to Encounter  Medication Sig Dispense Refill  . aspirin 81 MG tablet  Take 81 mg by mouth daily.      . Coenzyme Q10 (COQ10) 200 MG CAPS Take 1 tablet by mouth daily.     Marland Kitchen levocetirizine (XYZAL) 5 MG tablet Take 5 mg by mouth daily as needed for allergies (evening).    . Multiple Vitamin (MULTIVITAMIN) capsule Take 1 capsule by mouth daily.      . rosuvastatin (CRESTOR) 10 MG tablet TAKE 1 TABLET BY MOUTH 3  TIMES WEEKLY (Patient taking differently: Take 10 mg by mouth 3 (three) times a week. Mon, Wed, Friday) 39 tablet 3    ROS: As per HPI. Comprehensive ROS otherwise negative.   Physical Examination: Blood pressure (!) 157/76, pulse (!) 50, temperature (!) 97.5 F (36.4 C), resp. rate 13, height 5\' 8"  (1.727 m), weight 75.2 kg, SpO2 100 %.  HEENT: [place]/AT Lungs: Respirations unlabored Ext: No  edema  Neurologic Examination: Mental Status: Awake, alert, fully oriented. Speech mildly dysarthric but fluent, with intact comprehension and naming.  Able to follow a 3 step command without difficulty. Good insight.  Cranial Nerves: II:  Visual fields intact with no extinction to DSS. PERRL.  III,IV, VI: Mild left ptosis. EOMI without nystagmus.  V,VII: Mild left facial droop. Subtle lag of left eyebrow relative to right when asked to raise them and left side of forehead furrows slightly less than on the right. Facial temp sensation equal bilaterally VIII: hearing intact to voice IX,X: No hoarseness or hypophonia XI: Symmetric XII: midline tongue extension  Motor: Right : Upper extremity   5/5    Left:     Upper extremity   5/5  Lower extremity   5/5     Lower extremity   5/5 Normal tone and bulk x 4.  No pronator drift.  Sensory: Temp and light touch sensation intact in BUE and BLE without asymmetry. No extinction to DSS.  Deep Tendon Reflexes:  2+ bilateral biceps, brachioradialis, patellae and achilles.  Plantars: Right: downgoing   Left: downgoing Cerebellar: No ataxia with FNF and H-S bilaterally  Gait: Deferred   Results for orders placed or performed during the hospital encounter of 11/02/19 (from the past 48 hour(s))  Ethanol     Status: None   Collection Time: 11/02/19  9:55 AM  Result Value Ref Range   Alcohol, Ethyl (B) <10 <10 mg/dL    Comment: (NOTE) Lowest detectable limit for serum alcohol is 10 mg/dL. For medical purposes only. Performed at [facility], [address], [place], [place]   Protime-INR     Status: None   Collection Time: 11/02/19  9:55 AM  Result Value Ref Range   Prothrombin Time 13.6 11.4 - 15.2 seconds   INR 1.1 0.8 - 1.2    Comment: (NOTE) INR goal varies based on device and disease states. Performed at [facility], [address], [place], [place]   APTT     Status: None   Collection Time: 11/02/19   9:55 AM  Result Value Ref Range   aPTT 31 24 - 36 seconds    Comment: Performed at [facility] [place] [facility], [address], [place], [place]  CBC     Status: None   Collection Time: 11/02/19  9:55 AM  Result Value Ref Range   WBC 4.4 4.0 - 10.5 K/uL   RBC 4.45 4.22 - 5.81 MIL/uL   Hemoglobin 14.4 13.0 - 17.0 g/dL   HCT 01/02/20 21.3 - 08.6 %   MCV  93.7 80.0 - 100.0 fL   MCH 32.4 26.0 - 34.0 pg   MCHC 34.5 30.0 - 36.0 g/dL   RDW 12.9 11.5 - 15.5 %   Platelets 174 150 - 400 K/uL   nRBC 0.0 0.0 - 0.2 %    Comment: Performed at [place] [address] [place], [place] [address]  Differential     Status: None   Collection Time: 11/02/19  9:55 AM  Result Value Ref Range   Neutrophils Relative % 47 %   Neutro Abs 2.1 1.7 - 7.7 K/uL   Lymphocytes Relative 37 %   Lymphs Abs 1.6 0.7 - 4.0 K/uL   Monocytes Relative 13 %   Monocytes Absolute 0.6 0.1 - 1.0 K/uL   Eosinophils Relative 2 %   Eosinophils Absolute 0.1 0.0 - 0.5 K/uL   Basophils Relative 1 %   Basophils Absolute 0.0 0.0 - 0.1 K/uL   Immature Granulocytes 0 %   Abs Immature Granulocytes 0.01 0.00 - 0.07 K/uL    Comment: Performed at [facility], [place] [address], [place], [place] [address]  Comprehensive metabolic panel     Status: Abnormal   Collection Time: 11/02/19  9:55 AM  Result Value Ref Range   Sodium 139 135 - 145 mmol/L   Potassium 3.8 3.5 - 5.1 mmol/L   Chloride 105 98 - 111 mmol/L   CO2 24 22 - 32 mmol/L   Glucose, Bld 108 (H) 70 - 99 mg/dL    Comment: Glucose reference range applies only to samples taken after fasting for at least 8 hours.   BUN 20 8 - 23 mg/dL   Creatinine, Ser 1.11 0.61 - 1.24 mg/dL   Calcium 9.3 8.9 - 10.3 mg/dL   Total Protein 7.1 6.5 - 8.1 g/dL   Albumin 3.4 (L) 3.5 - 5.0 g/dL   AST 29 15 - 41 U/L   ALT 17 0 - 44 U/L   Alkaline Phosphatase 59 38 - 126 U/L   Total Bilirubin 1.4 (H) 0.3 - 1.2 mg/dL   GFR calc non Af Amer >60 >60 mL/min   GFR calc Af Amer >60 >60  mL/min   Anion gap 10 5 - 15    Comment: Performed at [facility], [place] [address], [place], [place] [address]  I-stat chem 8, ED     Status: None   Collection Time: 11/02/19  9:58 AM  Result Value Ref Range   Sodium 141 135 - 145 mmol/L   Potassium 3.7 3.5 - 5.1 mmol/L   Chloride 105 98 - 111 mmol/L   BUN 23 8 - 23 mg/dL   Creatinine, Ser 1.00 0.61 - 1.24 mg/dL   Glucose, Bld 99 70 - 99 mg/dL    Comment: Glucose reference range applies only to samples taken after fasting for at least 8 hours.   Calcium, Ion 1.23 1.15 - 1.40 mmol/L   TCO2 28 22 - 32 mmol/L   Hemoglobin 13.9 13.0 - 17.0 g/dL   HCT 41.0 39.0 - 52.0 %  Respiratory Panel by RT PCR (Flu A&B, Covid) - Nasopharyngeal Swab     Status: None   Collection Time: 11/02/19 11:05 AM   Specimen: Nasopharyngeal Swab  Result Value Ref Range   SARS Coronavirus 2 by RT PCR NEGATIVE NEGATIVE    Comment: (NOTE) SARS-CoV-2 target nucleic acids are NOT DETECTED. The SARS-CoV-2 RNA is generally detectable in upper respiratoy specimens during the acute phase of infection. The lowest  concentration of SARS-CoV-2 viral copies this assay can detect is 131 copies/mL. A negative result does not preclude SARS-Cov-2 infection and should not be used as the sole basis for treatment or other patient management decisions. A negative result may occur with  improper specimen collection/handling, submission of specimen other than nasopharyngeal swab, presence of viral mutation(s) within the areas targeted by this assay, and inadequate number of viral copies (<131 copies/mL). A negative result must be combined with clinical observations, patient history, and epidemiological information. The expected result is Negative. Fact Sheet for Patients:  https://www.moore.com/ Fact Sheet for Healthcare Providers:  https://www.young.biz/ This test is not yet ap proved or cleared by the [place] FDA and  has been  authorized for detection and/or diagnosis of SARS-CoV-2 by FDA under an Emergency Use Authorization (EUA). This EUA will remain  in effect (meaning this test can be used) for the duration of the COVID-19 declaration under Section 564(b)(1) of the Act, 21 U.S.C. section 360bbb-3(b)(1), unless the authorization is terminated or revoked sooner.    Influenza A by PCR NEGATIVE NEGATIVE   Influenza B by PCR NEGATIVE NEGATIVE    Comment: (NOTE) The Xpert Xpress SARS-CoV-2/FLU/RSV assay is intended as an aid in  the diagnosis of influenza from Nasopharyngeal swab specimens and  should not be used as a sole basis for treatment. Nasal washings and  aspirates are unacceptable for Xpert Xpress SARS-CoV-2/FLU/RSV  testing. Fact Sheet for Patients: https://www.moore.com/ Fact Sheet for Healthcare Providers: https://www.young.biz/ This test is not yet approved or cleared by the [place] FDA and  has been authorized for detection and/or diagnosis of SARS-CoV-2 by  FDA under an Emergency Use Authorization (EUA). This EUA will remain  in effect (meaning this test can be used) for the duration of the  Covid-19 declaration under Section 564(b)(1) of the Act, 21  U.S.C. section 360bbb-3(b)(1), unless the authorization is  terminated or revoked. Performed at [facility], [address], [place], [place] [address]   Urine rapid drug screen (hosp performed)     Status: None   Collection Time: 11/02/19 11:22 AM  Result Value Ref Range   Opiates NONE DETECTED NONE DETECTED   Cocaine NONE DETECTED NONE DETECTED   Benzodiazepines NONE DETECTED NONE DETECTED   Amphetamines NONE DETECTED NONE DETECTED   Tetrahydrocannabinol NONE DETECTED NONE DETECTED   Barbiturates NONE DETECTED NONE DETECTED    Comment: (NOTE) DRUG SCREEN FOR MEDICAL PURPOSES ONLY.  IF CONFIRMATION IS NEEDED FOR ANY PURPOSE, NOTIFY LAB WITHIN 5 DAYS. LOWEST DETECTABLE LIMITS FOR URINE  DRUG SCREEN Drug Class                     Cutoff (ng/mL) Amphetamine and metabolites    1000 Barbiturate and metabolites    200 Benzodiazepine                 200 Tricyclics and metabolites     300 Opiates and metabolites        300 Cocaine and metabolites        300 THC                            50 Performed at [facility], [address] [place], [place] [address]   Urinalysis, Routine w reflex microscopic     Status: None   Collection Time: 11/02/19 11:22 AM  Result Value Ref Range   Color, Urine YELLOW YELLOW  APPearance CLEAR CLEAR   Specific Gravity, Urine 1.008 1.005 - 1.030   pH 7.0 5.0 - 8.0   Glucose, UA NEGATIVE NEGATIVE mg/dL   Hgb urine dipstick NEGATIVE NEGATIVE   Bilirubin Urine NEGATIVE NEGATIVE   Ketones, ur NEGATIVE NEGATIVE mg/dL   Protein, ur NEGATIVE NEGATIVE mg/dL   Nitrite NEGATIVE NEGATIVE   Leukocytes,Ua NEGATIVE NEGATIVE    Comment: Performed at [facility], [address] [place], [place] [address]  Hemoglobin A1c     Status: Abnormal   Collection Time: 11/02/19 11:26 AM  Result Value Ref Range   Hgb A1c MFr Bld 4.7 (L) 4.8 - 5.6 %    Comment: (NOTE) Pre diabetes:          5.7%-6.4% Diabetes:              >6.4% Glycemic control for   <7.0% adults with diabetes    Mean Plasma Glucose 88.19 mg/dL    Comment: Performed at [facility], [address] [place], [place] [address]  Lipid panel     Status: Abnormal   Collection Time: 11/02/19 11:26 AM  Result Value Ref Range   Cholesterol 207 (H) 0 - 200 mg/dL   Triglycerides 65 <782 mg/dL   HDL 64 >95 mg/dL   Total CHOL/HDL Ratio 3.2 RATIO   VLDL 13 0 - 40 mg/dL   LDL Cholesterol 621 (H) 0 - 99 mg/dL    Comment:        Total Cholesterol/HDL:CHD Risk Coronary Heart Disease Risk Table                     Men   Women  1/2 Average Risk   3.4   3.3  Average Risk       5.0   4.4  2 X Average Risk   9.6   7.1  3 X Average Risk  23.4   11.0        Use the calculated  Patient Ratio above and the CHD Risk Table to determine the patient's CHD Risk.        ATP III CLASSIFICATION (LDL):  <100     mg/dL   Optimal  308-657  mg/dL   Near or Above                    Optimal  130-159  mg/dL   Borderline  846-962  mg/dL   High  >952     mg/dL   Very High Performed at [facility], [address] [place], [place] [address]    DG Chest 2 View  Result Date: 11/02/2019 CLINICAL DATA:  Left hand numbness EXAM: CHEST - 2 VIEW COMPARISON:  07/28/2006 FINDINGS: The heart size and mediastinal contours are within normal limits. No focal airspace consolidation, pleural effusion, or pneumothorax. The visualized skeletal structures are unremarkable. IMPRESSION: No active cardiopulmonary disease. Electronically Signed   By: Duanne Guess D.O.   On: 11/02/2019 12:00   CT HEAD CODE STROKE WO CONTRAST  Result Date: 11/02/2019 CLINICAL DATA:  Code stroke.  Slurred speech and facial droop EXAM: CT HEAD WITHOUT CONTRAST TECHNIQUE: Contiguous axial images were obtained from the base of the skull through the vertex without intravenous contrast. COMPARISON:  None. FINDINGS: Brain: No evidence of acute infarction, hemorrhage, hydrocephalus, extra-axial collection or mass lesion/mass effect. Vascular: Negative for hyperdense vessel Skull: Negative Sinuses/Orbits: Paranasal sinuses clear. Bilateral cataract extraction. Other: None ASPECTS [facility] Stroke Program Early  CT Score) - Ganglionic level infarction (caudate, lentiform nuclei, internal capsule, insula, M1-M3 cortex): 7 - Supraganglionic infarction (M4-M6 cortex): 3 Total score (0-10 with 10 being normal): 10 IMPRESSION: 1. No acute abnormality 2. ASPECTS is 10 Electronically Signed   By: Marlan Palauharles  Clark M.D.   On: 11/02/2019 10:14    Assessment: 64 y.o. male presenting with acute onset of left facial droop, left face/arm tingling and dysarthria.  1. 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  2. NIHSS = 2 3. The patient declined tPA after full discussion of risks versus benefits.  4. Stroke Risk Factors - HLD and mild carotid artery disease.  5. History of Acute Intermittent Porphyria with one prior attack in the past.   Plan: 1. ASA 650 mg po crushed x 1 now. 2. MRI and MRA of the brain without contrast 3. PT consult, OT consult, Speech consult 4. Echocardiogram 5. Carotid dopplers 6. Prophylactic therapy- Increase home ASA dose to 325 mg po qd.  7. Risk factor modification 8. Telemetry monitoring 9. Frequent neuro checks 10. HgbA1c, fasting lipid panel 11. Modified permissive HTN protocol due to patient's history of AIP. Treat SBP if > 180. Of note, AIP is often associated with HTN as one of its symptoms.   @Electronically  signed: Dr. Caryl PinaEric Peggi Yono@  11/02/2019, 12:35 PM

## 2019-11-02 NOTE — Evaluation (Signed)
Physical Therapy Evaluation and Discharge Patient Details Name: Kyle Hansen MRN: [id number] DOB: 1955-11-01 Today's Date: 11/02/2019   History of Present Illness  Pt is a 64 y/o male admitted secondary to L extremity weakness and numbness; per pt symptoms resolved. PMH includes CAD.   Clinical Impression  Patient evaluated by Physical Therapy with no further acute PT needs identified. All education has been completed and the patient has no further questions. Pt overall mod I to independent with mobility tasks. Scored 22 on DGI indicating low fall risk. Pt reports symptoms have resolved. Educated about "BE FAST" in recognizing CVA symptoms. See below for any follow-up Physical Therapy or equipment needs. PT is signing off. Thank you for this referral. If needs change, please re-consult.    Follow Up Recommendations No PT follow up    Equipment Recommendations  None recommended by PT    Recommendations for Other Services       Precautions / Restrictions Precautions Precautions: None Restrictions Weight Bearing Restrictions: No      Mobility  Bed Mobility Overal bed mobility: Independent                Transfers Overall transfer level: Independent                  Ambulation/Gait Ambulation/Gait assistance: Independent Gait Distance (Feet): 250 Feet Assistive device: None Gait Pattern/deviations: WFL(Within Functional Limits) Gait velocity: WFL    General Gait Details: Pt overall steady with dynamic gait tasks during ambulation. No LOB noted with DGI tasks.   Stairs Stairs: Yes Stairs assistance: Modified independent (Device/Increase time) Stair Management: One rail Left;Forwards;Alternating pattern Number of Stairs: 2 General stair comments: Overall steady with stair navigation. No LOB noted with use of rail.   Wheelchair Mobility    Modified Rankin (Stroke Patients Only) Modified Rankin (Stroke Patients Only) Pre-Morbid Rankin Score: No  symptoms Modified Rankin: No symptoms     Balance Overall balance assessment: Needs assistance Sitting-balance support: No upper extremity supported Sitting balance-Leahy Scale: Normal     Standing balance support: No upper extremity supported Standing balance-Leahy Scale: Normal                   Standardized Balance Assessment Standardized Balance Assessment : Dynamic Gait Index   Dynamic Gait Index Level Surface: Normal Change in Gait Speed: Normal Gait with Horizontal Head Turns: Normal Gait with Vertical Head Turns: Normal Gait and Pivot Turn: Mild Impairment Step Over Obstacle: Normal Step Around Obstacles: Normal Steps: Mild Impairment Total Score: 22       Pertinent Vitals/Pain Pain Assessment: No/denies pain    Home Living Family/patient expects to be discharged to:: Private residence Living Arrangements: Spouse/significant other Available Help at Discharge: Family Type of Home: House Home Access: Stairs to enter Entrance Stairs-Rails: Left Entrance Stairs-Number of Steps: 2 Home Layout: Two level;Able to live on main level with bedroom/bathroom Home Equipment: Gilford Rile - 2 wheels;Cane - single point;Crutches      Prior Function Level of Independence: Independent               Hand Dominance        Extremity/Trunk Assessment   Upper Extremity Assessment Upper Extremity Assessment: Overall WFL for tasks assessed(reports LUE numbness has resolved)    Lower Extremity Assessment Lower Extremity Assessment: Overall WFL for tasks assessed    Cervical / Trunk Assessment Cervical / Trunk Assessment: Normal  Communication   Communication: No difficulties  Cognition Arousal/Alertness: Awake/alert Behavior During Therapy: [facility]  for tasks assessed/performed Overall Cognitive Status: Within Functional Limits for tasks assessed                                        General Comments General comments (skin integrity, edema,  etc.): Pt's wife and daughter in room. Educated about "BE FAST" symptoms in recognizing CVA symptoms.     Exercises     Assessment/Plan    PT Assessment Patent does not need any further PT services  PT Problem List         PT Treatment Interventions      PT Goals (Current goals can be found in the Care Plan section)  Acute Rehab PT Goals Patient Stated Goal: to go home PT Goal Formulation: With patient Time For Goal Achievement: 11/02/19 Potential to Achieve Goals: Good    Frequency     Barriers to discharge        Co-evaluation               AM-PAC PT "6 Clicks" Mobility  Outcome Measure Help needed turning from your back to your side while in a flat bed without using bedrails?: None Help needed moving from lying on your back to sitting on the side of a flat bed without using bedrails?: None Help needed moving to and from a bed to a chair (including a wheelchair)?: None Help needed standing up from a chair using your arms (e.g., wheelchair or bedside chair)?: None Help needed to walk in hospital room?: None Help needed climbing 3-5 steps with a railing? : None 6 Click Score: 24    End of Session Equipment Utilized During Treatment: Gait belt Activity Tolerance: Patient tolerated treatment well Patient left: in bed;with call bell/phone within reach;with family/visitor present Nurse Communication: Mobility status PT Visit Diagnosis: Other abnormalities of gait and mobility (R26.89)    Time: 3094-0768 PT Time Calculation (min) (ACUTE ONLY): 15 min   Charges:   PT Evaluation $PT Eval Low Complexity: 1 Low          Cindee Salt, DPT  Acute Rehabilitation Services  Pager: [phone] Office: [phone]   Lehman Prom 11/02/2019, 6:03 PM

## 2019-11-03 ENCOUNTER — Inpatient Hospital Stay (HOSPITAL_COMMUNITY): Payer: Medicare Other

## 2019-11-03 DIAGNOSIS — I63411 Cerebral infarction due to embolism of right middle cerebral artery: Secondary | ICD-10-CM

## 2019-11-03 DIAGNOSIS — I6389 Other cerebral infarction: Secondary | ICD-10-CM

## 2019-11-03 LAB — ECHOCARDIOGRAM COMPLETE
Height: 68 in
Weight: 2652.57 oz

## 2019-11-03 LAB — FERRITIN: Ferritin: 222 ng/mL (ref 24–336)

## 2019-11-03 MED ORDER — WHITE PETROLATUM EX OINT
TOPICAL_OINTMENT | CUTANEOUS | Status: AC
  Filled 2019-11-03: qty 28.35

## 2019-11-03 MED ORDER — CLOPIDOGREL BISULFATE 75 MG PO TABS
75.0000 mg | ORAL_TABLET | Freq: Every day | ORAL | Status: DC
  Administered 2019-11-03: 75 mg via ORAL
  Filled 2019-11-03: qty 1

## 2019-11-03 MED ORDER — LORAZEPAM 2 MG/ML IJ SOLN
1.0000 mg | Freq: Once | INTRAMUSCULAR | Status: AC
  Administered 2019-11-03: 1 mg via INTRAVENOUS
  Filled 2019-11-03: qty 1

## 2019-11-03 MED ORDER — ROSUVASTATIN CALCIUM 40 MG PO TABS: 40.0000 mg | ORAL_TABLET | Freq: Every day | ORAL | 0 refills | Status: DC

## 2019-11-03 MED ORDER — ASPIRIN 81 MG PO TABS: 81.0000 mg | ORAL_TABLET | Freq: Every day | ORAL | 0 refills | Status: AC

## 2019-11-03 MED ORDER — CLOPIDOGREL BISULFATE 75 MG PO TABS: 75.0000 mg | ORAL_TABLET | Freq: Every day | ORAL | 0 refills | Status: DC

## 2019-11-03 MED ORDER — ASPIRIN EC 81 MG PO TBEC
81.0000 mg | DELAYED_RELEASE_TABLET | Freq: Every day | ORAL | Status: DC
  Administered 2019-11-03: 81 mg via ORAL
  Filled 2019-11-03: qty 1

## 2019-11-03 MED ORDER — ADULT MULTIVITAMIN W/MINERALS CH
1.0000 | ORAL_TABLET | Freq: Every day | ORAL | Status: DC
  Administered 2019-11-03: 1 via ORAL
  Filled 2019-11-03: qty 1

## 2019-11-03 NOTE — Progress Notes (Signed)
SLP Cancellation Note  Patient Details Name: Kyle Hansen MRN: [id number] DOB: 27-Jul-1955   Cancelled treatment:       Reason Eval/Treat Not Completed: SLP screened, no needs identified, will sign off   Blenda Mounts Laurice 11/03/2019, 8:40 AM

## 2019-11-03 NOTE — Social Work (Signed)
CSW met with pt at bedside. CSW introduced self and explained her role. CSW completed sbirt with pt.  Pt scored a 0 on the sbirt scale. Pt denied alcohol use. Pt denied substance use. Pt did not need resources at this time.  Emeterio Reeve, Latanya Presser, [place] Social Worker [phone]

## 2019-11-03 NOTE — Progress Notes (Signed)
  Echocardiogram 2D Echocardiogram has been performed.  Kyle Hansen 11/03/2019, 10:49 AM

## 2019-11-03 NOTE — Discharge Summary (Signed)
Physician Discharge Summary  Kyle Hansen:[id number] DOB: July 28, 1955 DOA: 11/02/2019  PCP: Gwenlyn Found, MD  Admit date: 11/02/2019 Discharge date: 11/03/2019  Admitted From: Home Disposition: Home  Recommendations for Outpatient Follow-up:  1. Follow up with PCP in 1-2 weeks 2. You will have cardiology follow-up and echocardiogram next week.  Office will call. 3. Outpatient follow-up with neurology, neurology office will call   Discharge Condition: Stable CODE STATUS: Full code Diet recommendation: Low-salt, low cholesterol diet.  Discharge summary: 64 year old gentleman with history of hyperlipidemia, carotid artery disease, family history of premature coronary artery disease presenting with left hand tingling, left facial tingling, left facial droop and slurred speech.  He was found to have acute right frontal infarct in the emergency room.  Most of the symptoms had improved after admission.  Was admitted to the hospital for acute stroke work-up.  Acute right frontal lobe infarction, suspect embolic infarction: Clinical findings, left hand tingling, left facial tingling, facial droop and slurred speech quickly improved. CT head findings, no acute findings. MRI of the brain, right posterior frontal lobe/tail of the caudate lobe and internal capsule infarct.  Old left frontal lobe infarct. CTA of the head and neck with atherosclerotic disease. Repeat MRI showed interval a small right posterior frontal cortical infarct. 2D echocardiogram, normal ejection fraction.  No source of emboli. Antiplatelet therapy, 81 mg of aspirin daily at home.  Neurology recommended aspirin 81 mg daily and Plavix 75 mg daily for 3 weeks then to continue Plavix alone. LDL 130.  Patient on Crestor 10 mg 3 times a week, goal is less than 70.  He will go up on Crestor 40 mg daily and will need outpatient follow-up. Hemoglobin A1c, 4.7.  No treatment indicated. Seen by PT OT and speech, no deficits  and no follow-up needed. Patient with possible embolic source, referred to cardiology for TEE and depending upon TEE finding, patient will need loop recorder placement.  This was all arranged and referral made to be scheduled for next week.  Patient is currently hemodynamically stable.  He has no neurological deficit.  All follow-up schedules are in place.  He is eager to go home.  Patient was admitted with acute stroke with anticipated hospitalization for more than 2 midnights.  His clinical status improved.  Expediated investigations, repeat MRIs, echocardiograms and angiograms were done in this hospitalization because of that we were able to discharge him today.  He is also able to finish some of the studies as outpatient.   Discharge Diagnoses:  Principal Problem:   CVA (cerebral vascular accident) (HCC) Active Problems:   Hyperlipidemia with target LDL less than 70   Mild carotid artery disease (HCC)   Acute intermittent porphyria (HCC)   Total bilirubin, elevated   Sinus bradycardia    Discharge Instructions  Discharge Instructions    Ambulatory referral to Neurology   Complete by: As directed    Dr. Delia Heady, Dr. Jamelle Rushing, or Dr. Naomie Dean.   Diet - low sodium heart healthy   Complete by: As directed    Discharge instructions   Complete by: As directed    He have increased and change in dose of your cholesterol medicine. Additional blood thinner is prescribed, Plavix. Cardiology will call you for procedure next week.   Increase activity slowly   Complete by: As directed      Allergies as of 11/03/2019      Reactions   Barbiturates Other (See Comments)   Can cause activity  of porphyria   Chloramphenicols Other (See Comments)   Can induce porphyria   Griseofulvin Other (See Comments)   Can induce porphyria   Librium Other (See Comments)   Can induce porphyria   Methsuximide Other (See Comments)   Can induce porphyria   [place] [meprobamate] Other  (See Comments)   Can induce porphyria   Nicergoline Other (See Comments)   Can induce porphyria   Orinase [tolbutamide] Other (See Comments)   Can induce porphyria   Sulfonamide Derivatives Other (See Comments)   Can induce porphyria   Tofranil-pm Other (See Comments)   Can induce porphyria   Atorvastatin Other (See Comments)   Muscle aches   Chlordiazepoxide    Imipramine    Phenytoin    Phenytoin Sodium Extended Other (See Comments)   Can induce porphyria   Sulfa Antibiotics       Medication List    TAKE these medications   aspirin 81 MG tablet Take 1 tablet (81 mg total) by mouth daily for 21 days.   clopidogrel 75 MG tablet Commonly known as: PLAVIX Take 1 tablet (75 mg total) by mouth daily. Start taking on: Nov 04, 2019   CoQ10 200 MG Caps Take 1 tablet by mouth daily.   levocetirizine 5 MG tablet Commonly known as: XYZAL Take 5 mg by mouth daily as needed for allergies (evening).   multivitamin capsule Take 1 capsule by mouth daily.   rosuvastatin 40 MG tablet Commonly known as: CRESTOR Take 1 tablet (40 mg total) by mouth daily. Start taking on: Nov 04, 2019 What changed:   medication strength  See the new instructions.      Follow-up Information    [facility] Follow up in 4 week(s).   Specialty: Neurology Why: stroke clinic. office will call with appt date and time.  Contact information: [address] [place] [address] [phone]         Allergies  Allergen Reactions  . Barbiturates Other (See Comments)    Can cause activity of porphyria  . Chloramphenicols Other (See Comments)    Can induce porphyria  . Griseofulvin Other (See Comments)    Can induce porphyria  . Librium Other (See Comments)    Can induce porphyria  . Methsuximide Other (See Comments)    Can induce porphyria  . [place] [Meprobamate] Other (See Comments)    Can induce porphyria  . Nicergoline Other (See Comments)     Can induce porphyria  . Orinase [Tolbutamide] Other (See Comments)    Can induce porphyria  . Sulfonamide Derivatives Other (See Comments)    Can induce porphyria  . Tofranil-Pm Other (See Comments)    Can induce porphyria  . Atorvastatin Other (See Comments)    Muscle aches  . Chlordiazepoxide   . Imipramine   . Phenytoin   . Phenytoin Sodium Extended Other (See Comments)    Can induce porphyria  . Sulfa Antibiotics     Consultations:  Neurology   Procedures/Studies: CT ANGIO HEAD W OR WO CONTRAST  Result Date: 11/02/2019 CLINICAL DATA:  Stroke follow-up EXAM: CT ANGIOGRAPHY HEAD AND NECK TECHNIQUE: Multidetector CT imaging of the head and neck was performed using the standard protocol during bolus administration of intravenous contrast. Multiplanar CT image reconstructions and MIPs were obtained to evaluate the vascular anatomy. Carotid stenosis measurements (when applicable) are obtained utilizing NASCET criteria, using the distal internal carotid diameter as the denominator. CONTRAST:  75mL OMNIPAQUE IOHEXOL 350 MG/ML SOLN COMPARISON:  None. FINDINGS: CTA NECK Aortic arch: Great vessel origins are patent. Right carotid system: Patent. Mild calcified plaque at the ICA origin causing minimal stenosis. Left carotid system: Patent. Calcified and noncalcified plaque at the ICA origin causing mild, less than 50% stenosis. Vertebral arteries: Patent.  Right vertebral artery is dominant. Skeleton: Degenerative changes of the cervical spine Other neck: No mass or adenopathy Upper chest: No apical lung mass Review of the MIP images confirms the above findings CTA HEAD Anterior circulation: Intracranial internal carotid arteries are patent with calcified plaque along the cavernous and paraclinoid portions causing mild to moderate stenosis on the left. There is calcified and noncalcified plaque along the left communicating segment with moderate to marked stenosis. Anterior and middle cerebral  arteries are patent. Posterior circulation: Intracranial vertebral arteries, basilar artery, and posterior cerebral arteries are patent. Venous sinuses: Patent as allowed by contrast bolus timing. Review of the MIP images confirms the above findings IMPRESSION: Atherosclerotic plaque without hemodynamically significant stenosis in the neck. 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 Electronically Signed   By: Macy Mis M.D.   On: 11/02/2019 21:15   DG Chest 2 View  Result Date: 11/02/2019 CLINICAL DATA:  Left hand numbness EXAM: CHEST - 2 VIEW COMPARISON:  07/28/2006 FINDINGS: The heart size and mediastinal contours are within normal limits. No focal airspace consolidation, pleural effusion, or pneumothorax. The visualized skeletal structures are unremarkable. IMPRESSION: No active cardiopulmonary disease. Electronically Signed   By: Davina Poke D.O.   On: 11/02/2019 12:00   CT ANGIO NECK W OR WO CONTRAST  Result Date: 11/02/2019 CLINICAL DATA:  Stroke follow-up EXAM: CT ANGIOGRAPHY HEAD AND NECK TECHNIQUE: Multidetector CT imaging of the head and neck was performed using the standard protocol during bolus administration of intravenous contrast. Multiplanar CT image reconstructions and MIPs were obtained to evaluate the vascular anatomy. Carotid stenosis measurements (when applicable) are obtained utilizing NASCET criteria, using the distal internal carotid diameter as the denominator. CONTRAST:  28mL OMNIPAQUE IOHEXOL 350 MG/ML SOLN COMPARISON:  None. FINDINGS: CTA NECK Aortic arch: Great vessel origins are patent. Right carotid system: Patent. Mild calcified plaque at the ICA origin causing minimal stenosis. Left carotid system: Patent. Calcified and noncalcified plaque at the ICA origin causing mild, less than 50% stenosis. Vertebral arteries: Patent.  Right vertebral artery is dominant. Skeleton: Degenerative changes of the cervical spine Other neck: No mass or adenopathy  Upper chest: No apical lung mass Review of the MIP images confirms the above findings CTA HEAD Anterior circulation: Intracranial internal carotid arteries are patent with calcified plaque along the cavernous and paraclinoid portions causing mild to moderate stenosis on the left. There is calcified and noncalcified plaque along the left communicating segment with moderate to marked stenosis. Anterior and middle cerebral arteries are patent. Posterior circulation: Intracranial vertebral arteries, basilar artery, and posterior cerebral arteries are patent. Venous sinuses: Patent as allowed by contrast bolus timing. Review of the MIP images confirms the above findings IMPRESSION: Atherosclerotic plaque without hemodynamically significant stenosis in the neck. 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 Electronically Signed   By: Macy Mis M.D.   On: 11/02/2019 21:15   MR BRAIN WO CONTRAST  Result Date: 11/03/2019 CLINICAL DATA:  Follow-up stroke EXAM: MRI HEAD WITHOUT CONTRAST TECHNIQUE: Multiplanar, multiecho pulse sequences of the brain and surrounding structures were obtained without intravenous contrast. COMPARISON:  MRI head yesterday FINDINGS: Limited study today with axial and coronal diffusion and axial FLAIR.  The area of ill-defined diffusion abnormality in the deep white matter on the right has resolved. There are now new small areas of acute infarct in the right posterior frontal cortex, measuring approximately 3 mm each. No other acute infarct. Ventricle size normal. Chronic microvascular ischemic change in the central pons. No mass or fluid collection. IMPRESSION: Interval development of small acute infarctions in the right posterior frontal cortex. Electronically Signed   By: Marlan Palauharles  Clark M.D.   On: 11/03/2019 11:16   MR BRAIN WO CONTRAST  Result Date: 11/02/2019 CLINICAL DATA:  Focal neuro deficit, greater than 6 hours, stroke suspected. Additional history provided:  64 year old male, last known normal 9 a.m. sudden onset left arm tingling and left face numbness, left facial droop. EXAM: MRI HEAD WITHOUT CONTRAST TECHNIQUE: Multiplanar, multiecho pulse sequences of the brain and surrounding structures were obtained without intravenous contrast. COMPARISON:  Noncontrast head CT 09/02/2019 FINDINGS: Brain: There is vague restricted diffusion within the posterior right frontal lobe centrum semiovale/corona radiata, tail of the right caudate nucleus and extending inferiorly within the right internal capsule. Findings are consistent with acute infarction given the provided history. Background mild scattered T2/FLAIR hyperintensity within the cerebral white matter and pons is nonspecific, but consistent with chronic small vessel ischemic disease. There is a small focus of SWI signal loss within the anterior left frontal lobe consistent with chronic blood products (series 7, image 54). Cerebral volume is normal. No evidence of intracranial mass. No extra-axial fluid collection. No midline shift. Vascular: Expected proximal arterial flow voids. Skull and upper cervical spine: No focal marrow lesion. Sinuses/Orbits: Bilateral lens replacements. Mild ethmoid and maxillary sinus mucosal thickening. Bilateral mastoid effusions. IMPRESSION: 1. Vague restricted diffusion within the posterior right frontal lobe white matter, tail of right caudate nucleus and extending inferiorly within the right internal capsule. Findings are consistent with acute infarction. 2. Mild chronic small vessel ischemic changes within the cerebral white matter and pons. 3. Small nonspecific chronic hemorrhage within the anterior left frontal lobe. 4. Mild ethmoid and maxillary sinus mucosal thickening. 5. Bilateral mastoid effusions. Electronically Signed   By: Jackey LogeKyle  Golden DO   On: 11/02/2019 12:51   ECHOCARDIOGRAM COMPLETE  Result Date: 11/03/2019    ECHOCARDIOGRAM REPORT   Patient Name:   Kyle Hansen  Date of Exam: 11/03/2019 Medical Rec #:  [id number]           Height:       68.0 in Accession #:    [id number]          Weight:       165.8 lb Date of Birth:  [birth year]           BSA:          1.887 m Patient Age:    63 years            BP:           117/90 mmHg Patient Gender: M                   HR:           62 bpm. Exam Location:  Inpatient Procedure: 2D Echo Indications:    TIA 435.9  History:        Patient has no prior history of Echocardiogram examinations.                 Risk Factors:Dyslipidemia and Family History of Coronary Artery  Disease.  Sonographer:    Delcie Roch Referring Phys: [id number] RONDELL A SMITH IMPRESSIONS  1. Left ventricular ejection fraction, by estimation, is 60 to 65%. The left ventricle has normal function. The left ventricle has no regional wall motion abnormalities. Left ventricular diastolic parameters were normal.  2. Right ventricular systolic function is normal. The right ventricular size is normal. There is normal pulmonary artery systolic pressure.  3. The mitral valve is normal in structure. Trivial mitral valve regurgitation. No evidence of mitral stenosis.  4. The aortic valve is bicuspid. Aortic valve regurgitation is not visualized. No aortic stenosis is present.  5. The inferior vena cava is normal in size with greater than 50% respiratory variability, suggesting right atrial pressure of 3 mmHg. Comparison(s): No prior Echocardiogram. Conclusion(s)/Recommendation(s): Bicuspid aortic valve without stenosis/regurgitation. Visualized portion of the aorta not enlarged. Otherwise normal study. FINDINGS  Left Ventricle: Left ventricular ejection fraction, by estimation, is 60 to 65%. The left ventricle has normal function. The left ventricle has no regional wall motion abnormalities. The left ventricular internal cavity size was normal in size. There is  no left ventricular hypertrophy. Left ventricular diastolic parameters were normal. Right Ventricle: The  right ventricular size is normal. No increase in right ventricular wall thickness. Right ventricular systolic function is normal. There is normal pulmonary artery systolic pressure. The tricuspid regurgitant velocity is 2.73 m/s, and  with an assumed right atrial pressure of 3 mmHg, the estimated right ventricular systolic pressure is 32.8 mmHg. Left Atrium: Left atrial size was normal in size. Right Atrium: Right atrial size was normal in size. Pericardium: There is no evidence of pericardial effusion. Mitral Valve: The mitral valve is normal in structure. Trivial mitral valve regurgitation. No evidence of mitral valve stenosis. Tricuspid Valve: The tricuspid valve is normal in structure. Tricuspid valve regurgitation is mild . No evidence of tricuspid stenosis. Aortic Valve: The aortic valve is bicuspid. . There is mild thickening and mild calcification of the aortic valve. Aortic valve regurgitation is not visualized. No aortic stenosis is present. There is mild thickening of the aortic valve. There is mild calcification of the aortic valve. Pulmonic Valve: The pulmonic valve was grossly normal. Pulmonic valve regurgitation is trivial. No evidence of pulmonic stenosis. Aorta: The aortic root, ascending aorta and aortic arch are all structurally normal, with no evidence of dilitation or obstruction. Venous: The inferior vena cava is normal in size with greater than 50% respiratory variability, suggesting right atrial pressure of 3 mmHg. IAS/Shunts: The atrial septum is grossly normal.  LEFT VENTRICLE PLAX 2D LVIDd:         4.70 cm  Diastology LVIDs:         3.20 cm  LV e' lateral:   14.40 cm/s LV PW:         1.10 cm  LV E/e' lateral: 5.6 LV IVS:        1.00 cm  LV e' medial:    8.16 cm/s LVOT diam:     1.90 cm  LV E/e' medial:  9.9 LV SV:         75 LV SV Index:   40 LVOT Area:     2.84 cm  RIGHT VENTRICLE RV S prime:     13.10 cm/s TAPSE (M-mode): 2.6 cm LEFT ATRIUM             Index       RIGHT ATRIUM            Index LA diam:  3.80 cm 2.01 cm/m  RA Area:     13.70 cm LA Vol (A2C):   65.2 ml 34.55 ml/m RA Volume:   31.00 ml  16.43 ml/m LA Vol (A4C):   39.5 ml 20.93 ml/m LA Biplane Vol: 52.4 ml 27.77 ml/m  AORTIC VALVE LVOT Vmax:   108.00 cm/s LVOT Vmean:  69.000 cm/s LVOT VTI:    0.264 m  AORTA Ao Root diam: 3.00 cm MITRAL VALVE               TRICUSPID VALVE MV Area (PHT): 2.02 cm    TR Peak grad:   29.8 mmHg MV Decel Time: 375 msec    TR Vmax:        273.00 cm/s MV E velocity: 81.00 cm/s MV A velocity: 54.40 cm/s  SHUNTS MV E/A ratio:  1.49        Systemic VTI:  0.26 m                            Systemic Diam: 1.90 cm Jodelle Red MD Electronically signed by Jodelle Red MD Signature Date/Time: 11/03/2019/3:04:23 PM    Final    CT HEAD CODE STROKE WO CONTRAST  Result Date: 11/02/2019 CLINICAL DATA:  Code stroke.  Slurred speech and facial droop EXAM: CT HEAD WITHOUT CONTRAST TECHNIQUE: Contiguous axial images were obtained from the base of the skull through the vertex without intravenous contrast. COMPARISON:  None. FINDINGS: Brain: No evidence of acute infarction, hemorrhage, hydrocephalus, extra-axial collection or mass lesion/mass effect. Vascular: Negative for hyperdense vessel Skull: Negative Sinuses/Orbits: Paranasal sinuses clear. Bilateral cataract extraction. Other: None ASPECTS ([place]berta Stroke Program Early CT Score) - Ganglionic level infarction (caudate, lentiform nuclei, internal capsule, insula, M1-M3 cortex): 7 - Supraganglionic infarction (M4-M6 cortex): 3 Total score (0-10 with 10 being normal): 10 IMPRESSION: 1. No acute abnormality 2. ASPECTS is 10 Electronically Signed   By: Marlan Palau M.D.   On: 11/02/2019 10:14     Subjective: Patient seen and examined.  No overnight events.  Wife at the bedside.  We discussed about his findings on his MRI, testing and investigations. Patient denied any complaints.  Today, he is without any symptoms and is eager to go  home.   Discharge Exam: Vitals:   11/03/19 0820 11/03/19 1211  BP: 117/90 119/73  Pulse: (!) 52 (!) 52  Resp: 15 16  Temp: 97.9 F (36.6 C) 98.8 F (37.1 C)  SpO2: 100% 100%   Vitals:   11/03/19 0415 11/03/19 0416 11/03/19 0820 11/03/19 1211  BP: 124/76  117/90 119/73  Pulse: (!) 51 61 (!) 52 (!) 52  Resp: Temp: (!) 97.4 F (36.3 C)  97.9 F (36.6 C) 98.8 F (37.1 C)  TempSrc: Oral  Axillary Oral  SpO2: 100% 97% 100% 100%  Weight:      Height:        General: Pt is alert, awake, not in acute distress Cardiovascular: RRR, S1/S2 +, no rubs, no gallops Respiratory: CTA bilaterally, no wheezing, no rhonchi Abdominal: Soft, NT, ND, bowel sounds + Extremities: no edema, no cyanosis No neurological findings.   The results of significant diagnostics from this hospitalization (including imaging, microbiology, ancillary and laboratory) are listed below for reference.     Microbiology: Recent Results (from the past 240 hour(s))  Respiratory Panel by RT PCR (Flu A&B, Covid) - Nasopharyngeal Swab     Status: None   Collection Time:  11/02/19 11:05 AM   Specimen: Nasopharyngeal Swab  Result Value Ref Range Status   SARS Coronavirus 2 by RT PCR NEGATIVE NEGATIVE Final    Comment: (NOTE) SARS-CoV-2 target nucleic acids are NOT DETECTED. The SARS-CoV-2 RNA is generally detectable in upper respiratoy specimens during the acute phase of infection. The lowest concentration of SARS-CoV-2 viral copies this assay can detect is 131 copies/mL. A negative result does not preclude SARS-Cov-2 infection and should not be used as the sole basis for treatment or other patient management decisions. A negative result may occur with  improper specimen collection/handling, submission of specimen other than nasopharyngeal swab, presence of viral mutation(s) within the areas targeted by this assay, and inadequate number of viral copies (<131 copies/mL). A negative result must be  combined with clinical observations, patient history, and epidemiological information. The expected result is Negative. Fact Sheet for Patients:  https://www.moore.com/ Fact Sheet for Healthcare Providers:  https://www.young.biz/ This test is not yet ap proved or cleared by the [place] FDA and  has been authorized for detection and/or diagnosis of SARS-CoV-2 by FDA under an Emergency Use Authorization (EUA). This EUA will remain  in effect (meaning this test can be used) for the duration of the COVID-19 declaration under Section 564(b)(1) of the Act, 21 U.S.C. section 360bbb-3(b)(1), unless the authorization is terminated or revoked sooner.    Influenza A by PCR NEGATIVE NEGATIVE Final   Influenza B by PCR NEGATIVE NEGATIVE Final    Comment: (NOTE) The Xpert Xpress SARS-CoV-2/FLU/RSV assay is intended as an aid in  the diagnosis of influenza from Nasopharyngeal swab specimens and  should not be used as a sole basis for treatment. Nasal washings and  aspirates are unacceptable for Xpert Xpress SARS-CoV-2/FLU/RSV  testing. Fact Sheet for Patients: https://www.moore.com/ Fact Sheet for Healthcare Providers: https://www.young.biz/ This test is not yet approved or cleared by the [place] FDA and  has been authorized for detection and/or diagnosis of SARS-CoV-2 by  FDA under an Emergency Use Authorization (EUA). This EUA will remain  in effect (meaning this test can be used) for the duration of the  Covid-19 declaration under Section 564(b)(1) of the Act, 21  U.S.C. section 360bbb-3(b)(1), unless the authorization is  terminated or revoked. Performed at [facility], [address], [place], [place] [address]      Labs: BNP (last 3 results) No results for input(s): BNP in the last 8760 hours. Basic Metabolic Panel: Recent Labs  Lab 11/02/19 0955 11/02/19 0958  NA 139 141  K 3.8 3.7   CL 105 105  CO2 24  --   GLUCOSE 108* 99  BUN 20 23  CREATININE 1.11 1.00  CALCIUM 9.3  --    Liver Function Tests: Recent Labs  Lab 11/02/19 0955  AST 29  ALT 17  ALKPHOS 59  BILITOT 1.4*  PROT 7.1  ALBUMIN 3.4*   No results for input(s): LIPASE, AMYLASE in the last 168 hours. No results for input(s): AMMONIA in the last 168 hours. CBC: Recent Labs  Lab 11/02/19 0955 11/02/19 0958  WBC 4.4  --   NEUTROABS 2.1  --   HGB 14.4 13.9  HCT 41.7 41.0  MCV 93.7  --   PLT 174  --    Cardiac Enzymes: No results for input(s): CKTOTAL, CKMB, CKMBINDEX, TROPONINI in the last 168 hours. BNP: Invalid input(s): POCBNP CBG: No results for input(s): GLUCAP in the last 168 hours. D-Dimer No results for input(s): DDIMER in the last 72  hours. Hgb A1c Recent Labs    11/02/19 1126  HGBA1C 4.7*   Lipid Profile Recent Labs    11/02/19 1126  CHOL 207*  HDL 64  LDLCALC 130*  TRIG 65  CHOLHDL 3.2   Thyroid function studies No results for input(s): TSH, T4TOTAL, T3FREE, THYROIDAB in the last 72 hours.  Invalid input(s): FREET3 Anemia work up Recent Labs    11/03/19 0439  FERRITIN 222   Urinalysis    Component Value Date/Time   COLORURINE YELLOW 11/02/2019 1122   APPEARANCEUR CLEAR 11/02/2019 1122   LABSPEC 1.008 11/02/2019 1122   PHURINE 7.0 11/02/2019 1122   GLUCOSEU NEGATIVE 11/02/2019 1122   HGBUR NEGATIVE 11/02/2019 1122   BILIRUBINUR NEGATIVE 11/02/2019 1122   KETONESUR NEGATIVE 11/02/2019 1122   PROTEINUR NEGATIVE 11/02/2019 1122   NITRITE NEGATIVE 11/02/2019 1122   LEUKOCYTESUR NEGATIVE 11/02/2019 1122   Sepsis Labs Invalid input(s): PROCALCITONIN,  WBC,  LACTICIDVEN Microbiology Recent Results (from the past 240 hour(s))  Respiratory Panel by RT PCR (Flu A&B, Covid) - Nasopharyngeal Swab     Status: None   Collection Time: 11/02/19 11:05 AM   Specimen: Nasopharyngeal Swab  Result Value Ref Range Status   SARS Coronavirus 2 by RT PCR NEGATIVE  NEGATIVE Final    Comment: (NOTE) SARS-CoV-2 target nucleic acids are NOT DETECTED. The SARS-CoV-2 RNA is generally detectable in upper respiratoy specimens during the acute phase of infection. The lowest concentration of SARS-CoV-2 viral copies this assay can detect is 131 copies/mL. A negative result does not preclude SARS-Cov-2 infection and should not be used as the sole basis for treatment or other patient management decisions. A negative result may occur with  improper specimen collection/handling, submission of specimen other than nasopharyngeal swab, presence of viral mutation(s) within the areas targeted by this assay, and inadequate number of viral copies (<131 copies/mL). A negative result must be combined with clinical observations, patient history, and epidemiological information. The expected result is Negative. Fact Sheet for Patients:  https://www.moore.com/ Fact Sheet for Healthcare Providers:  https://www.young.biz/ This test is not yet ap proved or cleared by the [place] FDA and  has been authorized for detection and/or diagnosis of SARS-CoV-2 by FDA under an Emergency Use Authorization (EUA). This EUA will remain  in effect (meaning this test can be used) for the duration of the COVID-19 declaration under Section 564(b)(1) of the Act, 21 U.S.C. section 360bbb-3(b)(1), unless the authorization is terminated or revoked sooner.    Influenza A by PCR NEGATIVE NEGATIVE Final   Influenza B by PCR NEGATIVE NEGATIVE Final    Comment: (NOTE) The Xpert Xpress SARS-CoV-2/FLU/RSV assay is intended as an aid in  the diagnosis of influenza from Nasopharyngeal swab specimens and  should not be used as a sole basis for treatment. Nasal washings and  aspirates are unacceptable for Xpert Xpress SARS-CoV-2/FLU/RSV  testing. Fact Sheet for Patients: https://www.moore.com/ Fact Sheet for Healthcare  Providers: https://www.young.biz/ This test is not yet approved or cleared by the [place] FDA and  has been authorized for detection and/or diagnosis of SARS-CoV-2 by  FDA under an Emergency Use Authorization (EUA). This EUA will remain  in effect (meaning this test can be used) for the duration of the  Covid-19 declaration under Section 564(b)(1) of the Act, 21  U.S.C. section 360bbb-3(b)(1), unless the authorization is  terminated or revoked. Performed at [facility], [address] [place], [place] [address]      Time coordinating discharge: 35 minutes  SIGNED:  Dorcas Carrow, MD  [facility] 11/03/2019, 3:30 PM

## 2019-11-03 NOTE — Progress Notes (Signed)
STROKE TEAM PROGRESS NOTE   INTERVAL HISTORY Wife at bedside.  Patient lying in bed, stated that his left arm and hand as well as face numbness tingling has been resolved.  Repeat MRI showed right frontal punctate cortical infarct, embolic pattern.  Patient denies any heart palpitations and racing heart.  Patient follows with Dr. Gery Pray in cardiology.  Vitals:   11/03/19 0415 11/03/19 0416 11/03/19 0820 11/03/19 1211  BP: 124/76  117/90 119/73  Pulse: (!) 51 61 (!) 52 (!) 52  Resp: 20 17 15 16   Temp: (!) 97.4 F (36.3 C)  97.9 F (36.6 C) 98.8 F (37.1 C)  TempSrc: Oral  Axillary Oral  SpO2: 100% 97% 100% 100%  Weight:      Height:        CBC:  Recent Labs  Lab 11/02/19 0955 11/02/19 0958  WBC 4.4  --   NEUTROABS 2.1  --   HGB 14.4 13.9  HCT 41.7 41.0  MCV 93.7  --   PLT 174  --     Basic Metabolic Panel:  Recent Labs  Lab 11/02/19 0955 11/02/19 0958  NA 139 141  K 3.8 3.7  CL 105 105  CO2 24  --   GLUCOSE 108* 99  BUN 20 23  CREATININE 1.11 1.00  CALCIUM 9.3  --    Lipid Panel:     Component Value Date/Time   CHOL 207 (H) 11/02/2019 1126   CHOL 181 01/10/2019 0902   TRIG 65 11/02/2019 1126   HDL 64 11/02/2019 1126   HDL 68 01/10/2019 0902   CHOLHDL 3.2 11/02/2019 1126   VLDL 13 11/02/2019 1126   LDLCALC 130 (H) 11/02/2019 1126   LDLCALC 103 (H) 01/10/2019 0902   HgbA1c:  Lab Results  Component Value Date   HGBA1C 4.7 (L) 11/02/2019   Urine Drug Screen:     Component Value Date/Time   LABOPIA NONE DETECTED 11/02/2019 1122   COCAINSCRNUR NONE DETECTED 11/02/2019 1122   LABBENZ NONE DETECTED 11/02/2019 1122   AMPHETMU NONE DETECTED 11/02/2019 1122   THCU NONE DETECTED 11/02/2019 1122   LABBARB NONE DETECTED 11/02/2019 1122    Alcohol Level     Component Value Date/Time   ETH <10 11/02/2019 0955    IMAGING past 24 hours CT ANGIO HEAD W OR WO CONTRAST  Result Date: 11/02/2019 CLINICAL DATA:  Stroke follow-up EXAM: CT ANGIOGRAPHY HEAD AND  NECK TECHNIQUE: Multidetector CT imaging of the head and neck was performed using the standard protocol during bolus administration of intravenous contrast. Multiplanar CT image reconstructions and MIPs were obtained to evaluate the vascular anatomy. Carotid stenosis measurements (when applicable) are obtained utilizing NASCET criteria, using the distal internal carotid diameter as the denominator. CONTRAST:  3mL OMNIPAQUE IOHEXOL 350 MG/ML SOLN COMPARISON:  None. FINDINGS: CTA NECK Aortic arch: Great vessel origins are patent. Right carotid system: Patent. Mild calcified plaque at the ICA origin causing minimal stenosis. Left carotid system: Patent. Calcified and noncalcified plaque at the ICA origin causing mild, less than 50% stenosis. Vertebral arteries: Patent.  Right vertebral artery is dominant. Skeleton: Degenerative changes of the cervical spine Other neck: No mass or adenopathy Upper chest: No apical lung mass Review of the MIP images confirms the above findings CTA HEAD Anterior circulation: Intracranial internal carotid arteries are patent with calcified plaque along the cavernous and paraclinoid portions causing mild to moderate stenosis on the left. There is calcified and noncalcified plaque along the left communicating segment with moderate to marked  stenosis. Anterior and middle cerebral arteries are patent. Posterior circulation: Intracranial vertebral arteries, basilar artery, and posterior cerebral arteries are patent. Venous sinuses: Patent as allowed by contrast bolus timing. Review of the MIP images confirms the above findings IMPRESSION: Atherosclerotic plaque without hemodynamically significant stenosis in the neck. 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 Electronically Signed   By: Guadlupe Spanish M.D.   On: 11/02/2019 21:15   CT ANGIO NECK W OR WO CONTRAST  Result Date: 11/02/2019 CLINICAL DATA:  Stroke follow-up EXAM: CT ANGIOGRAPHY HEAD AND NECK TECHNIQUE:  Multidetector CT imaging of the head and neck was performed using the standard protocol during bolus administration of intravenous contrast. Multiplanar CT image reconstructions and MIPs were obtained to evaluate the vascular anatomy. Carotid stenosis measurements (when applicable) are obtained utilizing NASCET criteria, using the distal internal carotid diameter as the denominator. CONTRAST:  87mL OMNIPAQUE IOHEXOL 350 MG/ML SOLN COMPARISON:  None. FINDINGS: CTA NECK Aortic arch: Great vessel origins are patent. Right carotid system: Patent. Mild calcified plaque at the ICA origin causing minimal stenosis. Left carotid system: Patent. Calcified and noncalcified plaque at the ICA origin causing mild, less than 50% stenosis. Vertebral arteries: Patent.  Right vertebral artery is dominant. Skeleton: Degenerative changes of the cervical spine Other neck: No mass or adenopathy Upper chest: No apical lung mass Review of the MIP images confirms the above findings CTA HEAD Anterior circulation: Intracranial internal carotid arteries are patent with calcified plaque along the cavernous and paraclinoid portions causing mild to moderate stenosis on the left. There is calcified and noncalcified plaque along the left communicating segment with moderate to marked stenosis. Anterior and middle cerebral arteries are patent. Posterior circulation: Intracranial vertebral arteries, basilar artery, and posterior cerebral arteries are patent. Venous sinuses: Patent as allowed by contrast bolus timing. Review of the MIP images confirms the above findings IMPRESSION: Atherosclerotic plaque without hemodynamically significant stenosis in the neck. 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 Electronically Signed   By: Guadlupe Spanish M.D.   On: 11/02/2019 21:15   MR BRAIN WO CONTRAST  Result Date: 11/03/2019 CLINICAL DATA:  Follow-up stroke EXAM: MRI HEAD WITHOUT CONTRAST TECHNIQUE: Multiplanar, multiecho pulse  sequences of the brain and surrounding structures were obtained without intravenous contrast. COMPARISON:  MRI head yesterday FINDINGS: Limited study today with axial and coronal diffusion and axial FLAIR. The area of ill-defined diffusion abnormality in the deep white matter on the right has resolved. There are now new small areas of acute infarct in the right posterior frontal cortex, measuring approximately 3 mm each. No other acute infarct. Ventricle size normal. Chronic microvascular ischemic change in the central pons. No mass or fluid collection. IMPRESSION: Interval development of small acute infarctions in the right posterior frontal cortex. Electronically Signed   By: Marlan Palau M.D.   On: 11/03/2019 11:16    PHYSICAL EXAM  Temp:  [97.4 F (36.3 C)-98.8 F (37.1 C)] 98.6 F (37 C) (05/07 1615) Pulse Rate:  [49-61] 52 (05/07 1615) Resp:  [15-20] 16 (05/07 1615) BP: (117-175)/(52-90) 118/72 (05/07 1615) SpO2:  [97 %-100 %] 100 % (05/07 1615)  General - Well nourished, well developed, in no apparent distress.  Ophthalmologic - fundi not visualized due to noncooperation.  Cardiovascular - Regular rhythm and rate.  Mental Status -  Level of arousal and orientation to time, place, and person were intact. Language including expression, naming, repetition, comprehension was assessed and found intact. Fund of Knowledge was  assessed and was intact.  Cranial Nerves II - XII - II - Visual field intact OU. III, IV, VI - Extraocular movements intact. V - Facial sensation intact bilaterally. VII - Facial movement intact bilaterally. VIII - Hearing & vestibular intact bilaterally. X - Palate elevates symmetrically. XI - Chin turning & shoulder shrug intact bilaterally. XII - Tongue protrusion intact.  Motor Strength - The patient's strength was normal in all extremities and pronator drift was absent.  Bulk was normal and fasciculations were absent.   Motor Tone - Muscle tone was  assessed at the neck and appendages and was normal.  Reflexes - The patient's reflexes were symmetrical in all extremities and he had no pathological reflexes.  Sensory - Light touch, temperature/pinprick were assessed and were symmetrical.    Coordination - The patient had normal movements in the hands and feet with no ataxia or dysmetria.  Tremor was absent.  Gait and Station - deferred.   ASSESSMENT/PLAN Kyle Hansen is a 64 y.o. male with history of HLD, carotid dz, family hx premature CAD presenting with L hand tingling, L facial tingling, L facial droop and slurred speech.   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 ASPECTS 10.     MRI small R posterior frontal cortical infarcts  CTA head & neck atherosclerosis in the neck. Supraclinoid L ICA moderate to severe stenosis.  2D Echo EF 60 to 65%  TEE to look for embolic source. Arranged with [facility] an an outpatient next week. If positive for PFO (patent foramen ovale), check bilateral lower extremity venous dopplers to rule out DVT as possible source of stroke.   If TEE negative, a [facility] electrophysiologist will consult and consider placement of an implantable loop recorder to evaluate for atrial fibrillation as etiology of stroke an an outpatient after TEE next week. This has been explained to patient/family by Dr. Erlinda Hong and they are agreeable.   LDL 130  HgbA1c 4.7  Lovenox 40 mg sq daily for VTE prophylaxis  aspirin 81 mg daily prior to admission, now on aspirin 81 mg daily and clopidogrel 75 mg daily. Continue DAPT x 3 weeks then plavix alone.    Therapy recommendations:  No therapy needs  Disposition:  Return home  Hypertension  Stable . Permissive hypertension (OK if < 220/120) but gradually normalize in 5-7 days . Long-term BP goal  normotensive  Hyperlipidemia  Home meds:  No statin   Now on crestor 40  LDL 130, goal < 70  Continue statin at discharge  Other Stroke Risk Factors  Family hx early CAD - follows with Dr. Gwenlyn Found cardiology  Other Active Problems  Sinus bradycardia  Acute intermittent porphyria  Hospital day # 1  Neurology will sign off. Please call with questions. Pt will follow up with stroke clinic NP at [facility] in about 4 weeks. Thanks for the consult.  Rosalin Hawking, MD PhD Stroke Neurology 11/03/2019 6:24 PM    To contact Stroke Continuity provider, please refer to [email or web address] After hours, contact General Neurology

## 2019-11-03 NOTE — Progress Notes (Signed)
Pt discharged, AVS given and pt education provided.  Peripheral IV removed, telemetry discontinue. Pt transported by w/c to [facility].

## 2019-11-06 ENCOUNTER — Telehealth: Payer: Self-pay | Admitting: Cardiovascular Disease

## 2019-11-06 NOTE — Telephone Encounter (Signed)
New Friday. P      Pt was discharged from the hospital on Friday and was told he nee a TEE. There is no order here for it. The wife wants to know who should be sending the order and what does she need to do to get it please?

## 2019-11-06 NOTE — Telephone Encounter (Signed)
Spoke with patient's wife per DPR. Patient discharged from hospital recently due to Stroke. Spouse reports she was told an order for a TEE was placed and cardiology would call to schedule TEE this week. Patient started on Plavix at the hospital. Spouse was told "TEE needed to be done this week to see where blood clots are coming from." She is also concerned because they are set to go on vacation soon and is worried patient may not be medically stable to go. Patient scheduled for next available office appointment which is 11/15/19. Will route to Dr. Allyson Sabal to review.

## 2019-11-06 NOTE — Telephone Encounter (Signed)
Spoke to patient's wife appointment with Dr.Berry moved up to [facility] 5/12 at 11:30 am.

## 2019-11-08 ENCOUNTER — Ambulatory Visit: Payer: Medicare Other | Admitting: Cardiovascular Disease

## 2019-11-08 ENCOUNTER — Encounter: Payer: Self-pay | Admitting: *Deleted

## 2019-11-08 ENCOUNTER — Telehealth: Payer: Self-pay | Admitting: *Deleted

## 2019-11-08 ENCOUNTER — Other Ambulatory Visit: Payer: Self-pay

## 2019-11-08 ENCOUNTER — Encounter: Payer: Self-pay | Admitting: Cardiovascular Disease

## 2019-11-08 DIAGNOSIS — E785 Hyperlipidemia, unspecified: Secondary | ICD-10-CM | POA: Diagnosis not present

## 2019-11-08 DIAGNOSIS — I63431 Cerebral infarction due to embolism of right posterior cerebral artery: Secondary | ICD-10-CM | POA: Diagnosis not present

## 2019-11-08 NOTE — Patient Instructions (Signed)
Medication Instructions:  NO CHANGE *If you need a refill on your cardiac medications before your next appointment, please call your pharmacy*   Lab Work: Your physician recommends that you return for lab work in: 2 MONTHS PRIOR TO EATING  If you have labs (blood work) drawn today and your tests are completely normal, you will receive your results only by: Marland Kitchen MyChart Message (if you have MyChart) OR . A paper copy in the mail If you have any lab test that is abnormal or we need to change your treatment, we will call you to review the results.   Testing/Procedures:  Your physician has recommended that you wear a 30 DAY event monitor. Event monitors are medical devices that record the heart's electrical activity. Doctors most often [place] these monitors to diagnose arrhythmias. Arrhythmias are problems with the speed or rhythm of the heartbeat. The monitor is a small, portable device. You can wear one while you do your normal daily activities. This is usually used to diagnose what is causing palpitations/syncope (passing out). WILL BE MAIL[facility] TO [facility]   You are scheduled for a TEE on 11-20-19 with Dr. Flora Lipps.  Please arrive at the [facility] (Main Entrance A) at [facility]: [address] [place], [place] [address] at 8 am. (1 hour prior to procedure unless lab work is needed; if lab work is needed arrive 1.5 hours ahead)  DIET: Nothing to eat or drink after midnight except a sip of water with medications (see medication instructions below)  Medication Instructions:  TAKE ALL MEDICATIONS WITH SIPS OF WATER  Continue your anticoagulant: ELIQUIS  Labs:   GO TO [facility] ON Friday 11/17/19 @ 11:20 AM FOR COVID TESTING  You must have a responsible person to drive you home and stay in the waiting area during your procedure. Failure to do so could result in cancellation.  Bring your insurance cards.  *Special Note: Every effort is made to have your procedure done on time.  Occasionally there are emergencies that occur at the hospital that may cause delays. Please be patient if a delay does occur.    Follow-Up: At [facility], you and your health needs are our priority.  As part of our continuing mission to provide you with exceptional heart care, we have created designated Provider Care Teams.  These Care Teams include your primary Cardiologist (physician) and Advanced Practice Providers (APPs -  Physician Assistants and Nurse Practitioners) who all work together to provide you with the care you need, when you need it.  We recommend signing up for the patient portal called "MyChart".  Sign up information is provided on this After Visit Summary.  MyChart is used to connect with patients for Virtual Visits (Telemedicine).  Patients are able to view lab/test results, encounter notes, upcoming appointments, etc.  Non-urgent messages can be sent to your provider as well.   To learn more about what you can do with MyChart, go to [email or web address].    Your next appointment:   6 month(s)  The format for your next appointment:   Either In Person or Virtual  Provider:   You may see Nanetta Batty, MD or one of the following Advanced Practice Providers on your designated Care Team:    Corine Shelter, PA-C  [place], [place]  Edd Fabian, [place]

## 2019-11-08 NOTE — H&P (View-Only) (Signed)
   11/08/2019 Kyle Hansen   07/25/1955  [id number]  Primary Physician Eksir, Samantha A, MD Primary Cardiologist: Kyle Hansen Kyle Vercher MD FACP, FACC, FAHA, FSCAI  HPI:  Kyle Hansen is Hansen 63 y.o.  thin-appearing, married Caucasian male, father of 2, grandfather to 2 grandchildren who I saw  01/10/2019. He is referred to me because of positive risk factors. His wife Kyle is also Hansen patient of mine, and is accompanying him today. Apparently she was recently diagnosed with breast cancer and underwent surgery for this recently.  She is currently cancer free. He has Hansen history of hyperlipidemia as well as Hansen strong family history for heart disease with Hansen father that had his first MI at 48. His brother had his first MI at age 35 and bypass grafting at 50. He has never had Hansen heart attack or stroke. He denies chest pain or shortness of breath. He had Hansen Myoview stress test performed several years ago in our office which is normal and carotid Dopplers that showed no evidence of ICA stenosis.  Since I saw him in July of last year he was admitted to [facility] with Hansen CVA 11/02/2019 with some speech impediment which resolved spontaneously.  Also had some left hand tingling.  His work-up was essentially benign.  He was discharged the following day.  He was placed on Plavix.  His statin drug was increased to 40 mg of Crestor daily.  He denies chest pain or shortness of breath.   Current Meds  Medication Sig  . aspirin 81 MG tablet Take 1 tablet (81 mg total) by mouth daily for 21 days.  . clopidogrel (PLAVIX) 75 MG tablet Take 1 tablet (75 mg total) by mouth daily.  . Coenzyme Q10 (COQ10) 200 MG CAPS Take 1 tablet by mouth daily.   . Multiple Vitamin (MULTIVITAMIN) capsule Take 1 capsule by mouth daily.    . rosuvastatin (CRESTOR) 40 MG tablet Take 1 tablet (40 mg total) by mouth daily.     Allergies  Allergen Reactions  . Barbiturates Other (See Comments)    Can cause activity of  porphyria  . Chloramphenicols Other (See Comments)    Can induce porphyria  . Griseofulvin Other (See Comments)    Can induce porphyria  . Librium Other (See Comments)    Can induce porphyria  . Methsuximide Other (See Comments)    Can induce porphyria  . [place] [Meprobamate] Other (See Comments)    Can induce porphyria  . Nicergoline Other (See Comments)    Can induce porphyria  . Orinase [Tolbutamide] Other (See Comments)    Can induce porphyria  . Sulfonamide Derivatives Other (See Comments)    Can induce porphyria  . Tofranil-Pm Other (See Comments)    Can induce porphyria  . Atorvastatin Other (See Comments)    Muscle aches  . Chlordiazepoxide   . Imipramine   . Phenytoin   . Phenytoin Sodium Extended Other (See Comments)    Can induce porphyria  . Sulfa Antibiotics     Social History   Socioeconomic History  . Marital status: Married    Spouse name: Kyle Hansen  . Number of children: 2  . Years of education: Not on file  . Highest education level: Not on file  Occupational History  . Occupation: Retired  Tobacco Use  . Smoking status: Never Smoker  . Smokeless tobacco: Current User  Substance and Sexual Activity  . Alcohol use: No  . Drug use: No  .   Sexual activity: Not on file  Other Topics Concern  . Not on file  Social History Narrative  . Not on file   Social Determinants of Health   Financial Resource Strain:   . Difficulty of Paying Living Expenses:   Food Insecurity:   . Worried About Programme researcher, broadcasting/film/video in the Last Year:   . Barista in the Last Year:   Transportation Needs:   . Freight forwarder (Medical):   Marland Kitchen Lack of Transportation (Non-Medical):   Physical Activity:   . Days of Exercise per Week:   . Minutes of Exercise per Session:   Stress:   . Feeling of Stress :   Social Connections:   . Frequency of Communication with Friends and Family:   . Frequency of Social Gatherings with Friends and Family:   . Attends  Religious Services:   . Active Member of Clubs or Organizations:   . Attends Banker Meetings:   Marland Kitchen Marital Status:   Intimate Partner Violence:   . Fear of Current or Ex-Partner:   . Emotionally Abused:   Marland Kitchen Physically Abused:   . Sexually Abused:      Review of Systems: General: negative for chills, fever, night sweats or weight changes.  Cardiovascular: negative for chest pain, dyspnea on exertion, edema, orthopnea, palpitations, paroxysmal nocturnal dyspnea or shortness of breath Dermatological: negative for rash Respiratory: negative for cough or wheezing Urologic: negative for hematuria Abdominal: negative for nausea, vomiting, diarrhea, bright red blood per rectum, melena, or hematemesis Neurologic: negative for visual changes, syncope, or dizziness All other systems reviewed and are otherwise negative except as noted above.    Blood pressure 118/72, pulse (!) 49, height 5\' 8"  (1.727 m), weight 169 lb (76.7 kg).  General appearance: alert and no distress Neck: no adenopathy, no carotid bruit, no JVD, supple, symmetrical, trachea midline and thyroid not enlarged, symmetric, no tenderness/mass/nodules Lungs: clear to auscultation bilaterally Heart: regular rate and rhythm, S1, S2 normal, no murmur, click, rub or gallop Extremities: extremities normal, atraumatic, no cyanosis or edema Pulses: 2+ and symmetric Skin: Skin color, texture, turgor normal. No rashes or lesions Neurologic: Alert and oriented X 3, normal strength and tone. Normal symmetric reflexes. Normal coordination and gait  EKG cardiac 49 without ST or T wave changes.  I personally reviewed this EKG.  ASSESSMENT AND PLAN:   Hyperlipidemia with target LDL less than 70 History of hyperlipidemia on low-dose statin therapy up until recently.  Lipid profile performed 11/02/2019 revealed Hansen total cholesterol 207, LDL 130 and HDL 64.  He is currently on Crestor 40 mg daily.  We will recheck Hansen lipid liver  profile in 2 months.  CVA (cerebral vascular accident) [facility]) Recent admission with CVA overnight discharged home the next day on 11/03/2019.  His work-up was unrevealing.  I am going to get Hansen transesophageal echo to rule out embolic source and Hansen 30-day event monitor rule out rule out Hansen. fib.      01/03/2020 MD FACP,FACC,FAHA, [facility] 11/08/2019 11:55 AM

## 2019-11-08 NOTE — Assessment & Plan Note (Signed)
Recent admission with CVA overnight discharged home the next day on 11/03/2019.  His work-up was unrevealing.  I am going to get a transesophageal echo to rule out embolic source and a 30-day event monitor rule out rule out A. fib.

## 2019-11-08 NOTE — Progress Notes (Signed)
11/08/2019 Kyle Hansen   [birth year]  [id number]  Primary Physician Nickola Major, MD Primary Cardiologist: Lorretta Harp MD Lupe Carney, [place]  HPI:  Kyle Hansen is a 64 y.o.  thin-appearing, married Caucasian male, father of 2, grandfather to 2 grandchildren who I saw  01/10/2019. He is referred to me because of positive risk factors. His wife Kyle Hansen is also a patient of mine, and is accompanying him today. Apparently she was recently diagnosed with breast cancer and underwent surgery for this recently.  She is currently cancer free. He has a history of hyperlipidemia as well as a strong family history for heart disease with a father that had his first MI at 63. His brother had his first MI at age 64 and bypass grafting at 75. He has never had a heart attack or stroke. He denies chest pain or shortness of breath. He had a Myoview stress test performed several years ago in our office which is normal and carotid Dopplers that showed no evidence of ICA stenosis.  Since I saw him in July of last year he was admitted to [facility] with a CVA 11/02/2019 with some speech impediment which resolved spontaneously.  Also had some left hand tingling.  His work-up was essentially benign.  He was discharged the following day.  He was placed on Plavix.  His statin drug was increased to 40 mg of Crestor daily.  He denies chest pain or shortness of breath.   Current Meds  Medication Sig  . aspirin 81 MG tablet Take 1 tablet (81 mg total) by mouth daily for 21 days.  . clopidogrel (PLAVIX) 75 MG tablet Take 1 tablet (75 mg total) by mouth daily.  . Coenzyme Q10 (COQ10) 200 MG CAPS Take 1 tablet by mouth daily.   . Multiple Vitamin (MULTIVITAMIN) capsule Take 1 capsule by mouth daily.    . rosuvastatin (CRESTOR) 40 MG tablet Take 1 tablet (40 mg total) by mouth daily.     Allergies  Allergen Reactions  . Barbiturates Other (See Comments)    Can cause activity of  porphyria  . Chloramphenicols Other (See Comments)    Can induce porphyria  . Griseofulvin Other (See Comments)    Can induce porphyria  . Librium Other (See Comments)    Can induce porphyria  . Methsuximide Other (See Comments)    Can induce porphyria  . [place] [Meprobamate] Other (See Comments)    Can induce porphyria  . Nicergoline Other (See Comments)    Can induce porphyria  . Orinase [Tolbutamide] Other (See Comments)    Can induce porphyria  . Sulfonamide Derivatives Other (See Comments)    Can induce porphyria  . Tofranil-Pm Other (See Comments)    Can induce porphyria  . Atorvastatin Other (See Comments)    Muscle aches  . Chlordiazepoxide   . Imipramine   . Phenytoin   . Phenytoin Sodium Extended Other (See Comments)    Can induce porphyria  . Sulfa Antibiotics     Social History   Socioeconomic History  . Marital status: Married    Spouse name: Paulanthony Gleaves  . Number of children: 2  . Years of education: Not on file  . Highest education level: Not on file  Occupational History  . Occupation: Retired  Tobacco Use  . Smoking status: Never Smoker  . Smokeless tobacco: Current User  Substance and Sexual Activity  . Alcohol use: No  . Drug use: No  .  Sexual activity: Not on file  Other Topics Concern  . Not on file  Social History Narrative  . Not on file   Social Determinants of Health   Financial Resource Strain:   . Difficulty of Paying Living Expenses:   Food Insecurity:   . Worried About Programme researcher, broadcasting/film/video in the Last Year:   . Barista in the Last Year:   Transportation Needs:   . Freight forwarder (Medical):   Marland Kitchen Lack of Transportation (Non-Medical):   Physical Activity:   . Days of Exercise per Week:   . Minutes of Exercise per Session:   Stress:   . Feeling of Stress :   Social Connections:   . Frequency of Communication with Friends and Family:   . Frequency of Social Gatherings with Friends and Family:   . Attends  Religious Services:   . Active Member of Clubs or Organizations:   . Attends Banker Meetings:   Marland Kitchen Marital Status:   Intimate Partner Violence:   . Fear of Current or Ex-Partner:   . Emotionally Abused:   Marland Kitchen Physically Abused:   . Sexually Abused:      Review of Systems: General: negative for chills, fever, night sweats or weight changes.  Cardiovascular: negative for chest pain, dyspnea on exertion, edema, orthopnea, palpitations, paroxysmal nocturnal dyspnea or shortness of breath Dermatological: negative for rash Respiratory: negative for cough or wheezing Urologic: negative for hematuria Abdominal: negative for nausea, vomiting, diarrhea, bright red blood per rectum, melena, or hematemesis Neurologic: negative for visual changes, syncope, or dizziness All other systems reviewed and are otherwise negative except as noted above.    Blood pressure 118/72, pulse (!) 49, height 5\' 8"  (1.727 m), weight 169 lb (76.7 kg).  General appearance: alert and no distress Neck: no adenopathy, no carotid bruit, no JVD, supple, symmetrical, trachea midline and thyroid not enlarged, symmetric, no tenderness/mass/nodules Lungs: clear to auscultation bilaterally Heart: regular rate and rhythm, S1, S2 normal, no murmur, click, rub or gallop Extremities: extremities normal, atraumatic, no cyanosis or edema Pulses: 2+ and symmetric Skin: Skin color, texture, turgor normal. No rashes or lesions Neurologic: Alert and oriented X 3, normal strength and tone. Normal symmetric reflexes. Normal coordination and gait  EKG cardiac 49 without ST or T wave changes.  I personally reviewed this EKG.  ASSESSMENT AND PLAN:   Hyperlipidemia with target LDL less than 70 History of hyperlipidemia on low-dose statin therapy up until recently.  Lipid profile performed 11/02/2019 revealed a total cholesterol 207, LDL 130 and HDL 64.  He is currently on Crestor 40 mg daily.  We will recheck a lipid liver  profile in 2 months.  CVA (cerebral vascular accident) [facility]) Recent admission with CVA overnight discharged home the next day on 11/03/2019.  His work-up was unrevealing.  I am going to get a transesophageal echo to rule out embolic source and a 30-day event monitor rule out rule out A. fib.      01/03/2020 MD FACP,FACC,FAHA, [facility] 11/08/2019 11:55 AM

## 2019-11-08 NOTE — Telephone Encounter (Signed)
Patient enrolled for [employer] to ship a 30 day cardiac event monitor to his home.  Instructions mailed and will also be included in his monitor kit.

## 2019-11-08 NOTE — Assessment & Plan Note (Signed)
History of hyperlipidemia on low-dose statin therapy up until recently.  Lipid profile performed 11/02/2019 revealed a total cholesterol 207, LDL 130 and HDL 64.  He is currently on Crestor 40 mg daily.  We will recheck a lipid liver profile in 2 months.

## 2019-11-14 ENCOUNTER — Encounter (INDEPENDENT_AMBULATORY_CARE_PROVIDER_SITE_OTHER): Payer: Medicare Other

## 2019-11-14 DIAGNOSIS — I4891 Unspecified atrial fibrillation: Secondary | ICD-10-CM

## 2019-11-14 DIAGNOSIS — I63431 Cerebral infarction due to embolism of right posterior cerebral artery: Secondary | ICD-10-CM | POA: Diagnosis not present

## 2019-11-15 ENCOUNTER — Ambulatory Visit: Payer: Medicare Other | Admitting: Cardiovascular Disease

## 2019-11-17 ENCOUNTER — Other Ambulatory Visit (HOSPITAL_COMMUNITY)
Admission: RE | Admit: 2019-11-17 | Discharge: 2019-11-17 | Disposition: A | Discharge status: Home / Self Care | Payer: Medicare Other | Source: Ambulatory Visit | Attending: Cardiovascular Disease | Admitting: Cardiovascular Disease

## 2019-11-17 DIAGNOSIS — Z20822 Contact with and (suspected) exposure to covid-19: Secondary | ICD-10-CM | POA: Diagnosis not present

## 2019-11-17 DIAGNOSIS — Z01812 Encounter for preprocedural laboratory examination: Secondary | ICD-10-CM | POA: Insufficient documentation

## 2019-11-17 LAB — SARS CORONAVIRUS 2 (TAT 6-24 HRS): SARS Coronavirus 2: NEGATIVE

## 2019-11-19 ENCOUNTER — Encounter (HOSPITAL_COMMUNITY): Payer: Self-pay | Admitting: Cardiovascular Disease

## 2019-11-19 NOTE — Anesthesia Preprocedure Evaluation (Addendum)
Anesthesia Evaluation  Patient identified by MRN, date of birth, ID band Patient awake    Reviewed: Allergy & Precautions, NPO status , Patient's Chart, lab work & pertinent test results  Airway Mallampati: I  TM Distance: >3 FB Neck ROM: Full    Dental no notable dental hx. 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   Anesthesia Other Findings   Reproductive/Obstetrics                            Anesthesia Physical Anesthesia Plan  ASA: III  Anesthesia Plan: MAC   Post-op Pain Management:    Induction: Intravenous  PONV Risk Score and Plan: 1 and Propofol infusion  Airway Management Planned: Natural Airway and Nasal Cannula  Additional Equipment:   Intra-op Plan:   Post-operative Plan:   Informed Consent: I have reviewed the patients History and Physical, chart, labs and discussed the procedure including the risks, benefits and alternatives for the proposed anesthesia with the patient or authorized representative who has indicated his/her understanding and acceptance.     Dental advisory given  Plan Discussed with: CRNA and Surgeon  Anesthesia Plan Comments:        Anesthesia Quick Evaluation

## 2019-11-20 ENCOUNTER — Ambulatory Visit (HOSPITAL_COMMUNITY): Payer: Medicare Other | Admitting: Anesthesiology

## 2019-11-20 ENCOUNTER — Other Ambulatory Visit: Payer: Self-pay

## 2019-11-20 ENCOUNTER — Encounter (HOSPITAL_COMMUNITY)
Admission: RE | Disposition: A | Discharge status: Home / Self Care | Payer: Self-pay | Source: Home / Self Care | Attending: Cardiovascular Disease

## 2019-11-20 ENCOUNTER — Encounter (HOSPITAL_COMMUNITY): Payer: Self-pay | Admitting: Cardiovascular Disease

## 2019-11-20 ENCOUNTER — Ambulatory Visit (HOSPITAL_COMMUNITY)
Admission: RE | Admit: 2019-11-20 | Discharge: 2019-11-20 | Disposition: A | Discharge status: Home / Self Care | Payer: Medicare Other | Attending: Cardiovascular Disease | Admitting: Cardiovascular Disease

## 2019-11-20 ENCOUNTER — Ambulatory Visit (HOSPITAL_BASED_OUTPATIENT_CLINIC_OR_DEPARTMENT_OTHER): Payer: Medicare Other

## 2019-11-20 DIAGNOSIS — I639 Cerebral infarction, unspecified: Secondary | ICD-10-CM | POA: Diagnosis not present

## 2019-11-20 DIAGNOSIS — Z7982 Long term (current) use of aspirin: Secondary | ICD-10-CM | POA: Diagnosis not present

## 2019-11-20 DIAGNOSIS — I340 Nonrheumatic mitral (valve) insufficiency: Secondary | ICD-10-CM | POA: Insufficient documentation

## 2019-11-20 DIAGNOSIS — Z7902 Long term (current) use of antithrombotics/antiplatelets: Secondary | ICD-10-CM | POA: Insufficient documentation

## 2019-11-20 DIAGNOSIS — Z79899 Other long term (current) drug therapy: Secondary | ICD-10-CM | POA: Diagnosis not present

## 2019-11-20 DIAGNOSIS — E785 Hyperlipidemia, unspecified: Secondary | ICD-10-CM | POA: Insufficient documentation

## 2019-11-20 DIAGNOSIS — Z888 Allergy status to other drugs, medicaments and biological substances status: Secondary | ICD-10-CM | POA: Diagnosis not present

## 2019-11-20 DIAGNOSIS — Z882 Allergy status to sulfonamides status: Secondary | ICD-10-CM | POA: Diagnosis not present

## 2019-11-20 DIAGNOSIS — Z883 Allergy status to other anti-infective agents status: Secondary | ICD-10-CM | POA: Insufficient documentation

## 2019-11-20 DIAGNOSIS — Z8249 Family history of ischemic heart disease and other diseases of the circulatory system: Secondary | ICD-10-CM | POA: Insufficient documentation

## 2019-11-20 DIAGNOSIS — I252 Old myocardial infarction: Secondary | ICD-10-CM | POA: Insufficient documentation

## 2019-11-20 DIAGNOSIS — I63431 Cerebral infarction due to embolism of right posterior cerebral artery: Secondary | ICD-10-CM

## 2019-11-20 HISTORY — PX: BUBBLE STUDY: SHX6837

## 2019-11-20 HISTORY — PX: TEE WITHOUT CARDIOVERSION: SHX5443

## 2019-11-20 SURGERY — ECHOCARDIOGRAM, TRANSESOPHAGEAL
Anesthesia: Monitor Anesthesia Care

## 2019-11-20 MED ORDER — LACTATED RINGERS IV SOLN: INTRAVENOUS | Status: DC

## 2019-11-20 MED ORDER — PROPOFOL 500 MG/50ML IV EMUL
INTRAVENOUS | Status: DC | PRN
  Administered 2019-11-20: 100 ug/kg/min via INTRAVENOUS

## 2019-11-20 MED ORDER — PROPOFOL 10 MG/ML IV BOLUS
INTRAVENOUS | Status: DC | PRN
  Administered 2019-11-20: 40 mg via INTRAVENOUS
  Administered 2019-11-20: 60 mg via INTRAVENOUS

## 2019-11-20 MED ORDER — SODIUM CHLORIDE 0.9 % IV SOLN: INTRAVENOUS | Status: DC

## 2019-11-20 MED ORDER — LIDOCAINE 2% (20 MG/ML) 5 ML SYRINGE
INTRAMUSCULAR | Status: DC | PRN
  Administered 2019-11-20: 40 mg via INTRAVENOUS

## 2019-11-20 MED ORDER — LIDOCAINE VISCOUS HCL 2 % MT SOLN
OROMUCOSAL | Status: DC | PRN
  Administered 2019-11-20: 20 mL via OROMUCOSAL

## 2019-11-20 MED ORDER — LIDOCAINE VISCOUS HCL 2 % MT SOLN
OROMUCOSAL | Status: AC
  Filled 2019-11-20: qty 15

## 2019-11-20 MED ORDER — LACTATED RINGERS IV SOLN: INTRAVENOUS | Status: DC | PRN

## 2019-11-20 MED ORDER — EPHEDRINE SULFATE-NACL 50-0.9 MG/10ML-% IV SOSY
PREFILLED_SYRINGE | INTRAVENOUS | Status: DC | PRN
  Administered 2019-11-20: 5 mg via INTRAVENOUS

## 2019-11-20 NOTE — CV Procedure (Signed)
    TRANSESOPHAGEAL ECHOCARDIOGRAM   NAME:  Kyle Hansen    MRN: [id number] DOB:  [birth year]    ADMIT DATE: 11/20/2019  INDICATIONS: Stroke  PROCEDURE:   Informed consent was obtained prior to the procedure. The risks, benefits and alternatives for the procedure were discussed and the patient comprehended these risks.  Risks include, but are not limited to, cough, sore throat, vomiting, nausea, somnolence, esophageal and stomach trauma or perforation, bleeding, low blood pressure, aspiration, pneumonia, infection, trauma to the teeth and death.    Procedural time out performed. The oropharynx was anesthetized with topical 1% benzocaine.    Anesthesia was administered by Dr. Malen Gauze.  The patient's heart rate, blood pressure, and oxygen saturation are monitored continuously during the procedure. The period of conscious sedation is 13 minutes, of which I was present face-to-face 100% of this time.   The transesophageal probe was inserted in the esophagus and stomach without difficulty and multiple views were obtained.   COMPLICATIONS:    There were no immediate complications.  KEY FINDINGS:  1. Normal LVEF, 65%.  2. Negative bubble study.  3. No LAA thrombus.  4. No cardiac source of embolus detected.  5. Full report to follow.  Gerri Spore T. Flora Lipps, MD [facility]  [address], [address] [place], [place] [address] [phone]  9:08 AM

## 2019-11-20 NOTE — Discharge Instructions (Signed)

## 2019-11-20 NOTE — Progress Notes (Addendum)
Echocardiogram Echocardiogram Transesophageal has been performed.  Warren Lacy Albertina Leise 11/20/2019, 9:18 AM

## 2019-11-20 NOTE — Transfer of Care (Signed)
Immediate Anesthesia Transfer of Care Note  Patient: Kyle Hansen  Procedure(s) Performed: TRANSESOPHAGEAL ECHOCARDIOGRAM (TEE) (N/A ) BUBBLE STUDY  Patient Location: Endoscopy Unit  Anesthesia Type:MAC  Level of Consciousness: drowsy  Airway & Oxygen Therapy: Patient Spontanous Breathing and Patient connected to face mask oxygen  Post-op Assessment: Report given to RN and Post -op Vital signs reviewed and stable  Post vital signs: Reviewed and stable  Last Vitals:  Vitals Value Taken Time  BP 128/62 11/20/19 0915  Temp    Pulse 43 11/20/19 0916  Resp 12 11/20/19 0916  SpO2 100 % 11/20/19 0916  Vitals shown include unvalidated device data.  Last Pain:  Vitals:   11/20/19 0755  TempSrc: Oral  PainSc: 2          Complications: No apparent anesthesia complications

## 2019-11-20 NOTE — Anesthesia Postprocedure Evaluation (Signed)
Anesthesia Post Note  Patient: AZIZ SLAPE  Procedure(s) Performed: TRANSESOPHAGEAL ECHOCARDIOGRAM (TEE) (N/A ) BUBBLE STUDY     Patient location during evaluation: PACU Anesthesia Type: MAC Level of consciousness: awake and alert and oriented Pain management: pain level controlled Vital Signs Assessment: post-procedure vital signs reviewed and stable Respiratory status: spontaneous breathing, nonlabored ventilation and respiratory function stable Cardiovascular status: stable and blood pressure returned to baseline Postop Assessment: no apparent nausea or vomiting Anesthetic complications: no    Last Vitals:  Vitals:   11/20/19 0755 11/20/19 0915  BP: (!) 163/64 128/62  Pulse:  (!) 43  Resp: 10 13  Temp: 36.7 C 36.7 C  SpO2: 100% 100%    Last Pain:  Vitals:   11/20/19 0915  TempSrc: Axillary  PainSc: 0-No pain                 Taleeya Blondin A.

## 2019-11-20 NOTE — Interval H&P Note (Signed)
History and Physical Interval Note:  11/20/2019 8:01 AM  Kyle Hansen  has presented today for surgery, with the diagnosis of STROKE.  The various methods of treatment have been discussed with the patient and family. After consideration of risks, benefits and other options for treatment, the patient has consented to  Procedure(s): TRANSESOPHAGEAL ECHOCARDIOGRAM (TEE) (N/A) as a surgical intervention.  The patient's history has been reviewed, patient examined, no change in status, stable for surgery.  I have reviewed the patient's chart and labs.  Questions were answered to the patient's satisfaction.    TEE with bubble study for stroke work-up. NPO since midnight. Consent obtained.   Gerri Spore T. Flora Lipps, MD [facility]  [address] [place], [place] [address] [phone]  8:01 AM

## 2019-12-14 ENCOUNTER — Other Ambulatory Visit: Payer: Self-pay | Admitting: Cardiovascular Disease

## 2019-12-14 DIAGNOSIS — I4891 Unspecified atrial fibrillation: Secondary | ICD-10-CM

## 2019-12-14 DIAGNOSIS — I63431 Cerebral infarction due to embolism of right posterior cerebral artery: Secondary | ICD-10-CM

## 2019-12-20 ENCOUNTER — Ambulatory Visit: Payer: Medicare Other | Admitting: Neurology

## 2019-12-20 ENCOUNTER — Encounter: Payer: Self-pay | Admitting: Neurology

## 2019-12-20 ENCOUNTER — Other Ambulatory Visit: Payer: Self-pay

## 2019-12-20 VITALS — BP 128/76 | HR 47 | Ht 68.0 in | Wt 165.8 lb

## 2019-12-20 DIAGNOSIS — I63431 Cerebral infarction due to embolism of right posterior cerebral artery: Secondary | ICD-10-CM | POA: Diagnosis not present

## 2019-12-20 NOTE — Progress Notes (Signed)
Reason for visit: Stroke  Referring physician: [facility]  Kyle Hansen is a 64 y.o. male  History of present illness:  Mr. Kyle Hansen is a 64 year old left-handed white male with a history of onset of left face tingling and left arm numbness and left facial droop that began on 02 Nov 2019.  The patient went to the hospital and was found to have 2 punctate right frontal stroke events that were thought to be embolic in nature.  A work-up was done that showed evidence of mild carotid vessel atherosclerosis that did not show any flow-limiting issues on the right.  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  The patient has undergone a 2D echocardiogram and a transesophageal echocardiogram that were unremarkable.  A cardiac monitor study did not show atrial fibrillation.  The patient was treated with aspirin and Plavix for 3 weeks and then was converted to Plavix alone.  He has had no residual of numbness or slurred speech following the stroke event.  His hemoglobin A1c was 4.7.  He does not drink alcohol or smoke cigarettes.  He denies issues with balance or problems with strength.  He denies issues controlling the bowels or the bladder.  He has not had any dizziness, headache, or syncope.  He is getting over a right rotator cuff surgery.  Past Medical History:  Diagnosis Date  . Blood dyscrasia   . Family history of premature CAD   . Hyperlipidemia LDL goal < 70   . Mild carotid artery disease (HCC) 11/2010   bilateral  . Normal cardiac stress test 12/17/2010   though there was a marked hypertensive response   . Porphyria (HCC)    multiple drugs to avoidsome meds are safe    Past Surgical History:  Procedure Laterality Date  . BUBBLE STUDY  11/20/2019   Procedure: BUBBLE STUDY;  Surgeon: Sande Rives, MD;  Location: [facility] ENDOSCOPY;  Service: Cardiovascular;;  . COLONOSCOPY    . FOOT ARTHRODESIS  2009   lt  . HERNIA REPAIR  1993  . MASS EXCISION   06/16/2011   Procedure: EXCISION MASS;  Surgeon: Wyn Forster., MD;  Location: [facility];  Service: Orthopedics;  Laterality: Left;  left elbow, and posterior interosseus nerve decompression with injection of right olecranon bursa  . ORIF CALCANEOUS FRACTURE  2008   lt  . PIP JOINT FUSION  2006   lt  . SHOULDER ARTHROSCOPY  2008   lt  . TEE WITHOUT CARDIOVERSION N/A 11/20/2019   Procedure: TRANSESOPHAGEAL ECHOCARDIOGRAM (TEE);  Surgeon: Sande Rives, MD;  Location: [facility] ENDOSCOPY;  Service: Cardiovascular;  Laterality: N/A;    Family History  Problem Relation Age of Onset  . Heart attack Father   . Heart disease Father   . Hyperlipidemia Father   . Heart attack Brother   . Heart disease Brother   . Hyperlipidemia Brother     Social history:  reports that he has never smoked. He uses smokeless tobacco. He reports that he does not drink alcohol and does not use drugs.  Medications:  Prior to Admission medications   Medication Sig Start Date End Date Taking? Authorizing Provider  ASPIRIN 81 PO Take by mouth.   Yes [provider]  clopidogrel (PLAVIX) 75 MG tablet Take 1 tablet (75 mg total) by mouth daily. 11/04/19 02/02/20 Yes Ghimire, Lyndel Safe, MD  Coenzyme Q10 (COQ10) 200 MG CAPS Take 200 mg by mouth daily.  Yes [provider]  Multiple Vitamin (MULTIVITAMIN) capsule Take 1 capsule by mouth daily.     Yes [provider]  rosuvastatin (CRESTOR) 40 MG tablet Take 1 tablet (40 mg total) by mouth daily. 11/04/19 02/02/20 Yes Barb Merino, MD      Allergies  Allergen Reactions  . Barbiturates Other (See Comments)    Can cause activity of porphyria  . Chloramphenicols Other (See Comments)    Can induce porphyria  . Griseofulvin Other (See Comments)    Can induce porphyria  . Librium Other (See Comments)    Can induce porphyria  . Methsuximide Other (See Comments)    Can induce porphyria  . [place] [Meprobamate] Other (See  Comments)    Can induce porphyria  . Nicergoline Other (See Comments)    Can induce porphyria  . Orinase [Tolbutamide] Other (See Comments)    Can induce porphyria  . Sulfonamide Derivatives Other (See Comments)    Can induce porphyria  . Tofranil-Pm Other (See Comments)    Can induce porphyria  . Atorvastatin Other (See Comments)    Muscle aches  . Chlordiazepoxide   . Imipramine   . Phenytoin   . Phenytoin Sodium Extended Other (See Comments)    Can induce porphyria  . Sulfa Antibiotics     ROS:  Out of a complete 14 system review of symptoms, the patient complains only of the following symptoms, and all other reviewed systems are negative.  Transient left-sided numbness, left facial droop Right shoulder pain  Blood pressure 128/76, pulse (!) 47, height 5\' 8"  (1.727 m), weight 165 lb 12.8 oz (75.2 kg).  Physical Exam  General: The patient is alert and cooperative at the time of the examination.  Eyes: Pupils are equal, round, and reactive to light. Discs are flat bilaterally.  Neck: The neck is supple, no carotid bruits are noted.  Respiratory: The respiratory examination is clear.  Cardiovascular: The cardiovascular examination reveals a regular rate and rhythm, no obvious murmurs or rubs are noted.  Skin: Extremities are without significant edema.  Neurologic Exam  Mental status: The patient is alert and oriented x 3 at the time of the examination. The patient has apparent normal recent and remote memory, with an apparently normal attention span and concentration ability.  Cranial nerves: Facial symmetry is present. There is good sensation of the face to pinprick and soft touch bilaterally. The strength of the facial muscles and the muscles to head turning and shoulder shrug are normal bilaterally. Speech is well enunciated, no aphasia or dysarthria is noted. Extraocular movements are full. Visual fields are full. The tongue is midline, and the patient has symmetric  elevation of the soft palate. No obvious hearing deficits are noted.  Motor: The motor testing reveals 5 over 5 strength of all 4 extremities. Good symmetric motor tone is noted throughout.  Sensory: Sensory testing is intact to pinprick, soft touch, vibration sensation, and position sense on all 4 extremities. No evidence of extinction is noted.  Coordination: Cerebellar testing reveals good finger-nose-finger and heel-to-shin bilaterally.  Gait and station: Gait is normal. Tandem gait is normal. Romberg is negative. No drift is seen.  Reflexes: Deep tendon reflexes are symmetric and normal bilaterally. Toes are downgoing bilaterally.   MRI brain 11/03/19:  IMPRESSION: Interval development of small acute infarctions in the right posterior frontal cortex.  * MRI scan images were reviewed online. I agree with the written report.   Cardiac monitor study 11/14/19:  1. SR/SB/ST 2. No  arrhthymias noted   TEE 11/20/19:  IMPRESSIONS    1. Left ventricular ejection fraction, by estimation, is 60 to 65%. The  left ventricle has normal function. The left ventricle has no regional  wall motion abnormalities.  2. Right ventricular systolic function is normal. The right ventricular  size is normal.  3. No left atrial/left atrial appendage thrombus was detected. The LAA  emptying velocity was 27 cm/s.  4. The mitral valve is grossly normal. Mild mitral valve regurgitation.  No evidence of mitral stenosis.  5. The aortic valve is tricuspid. Aortic valve regurgitation is not  visualized. No aortic stenosis is present.  6. Agitated saline contrast bubble study was negative, with no evidence  of any interatrial shunt.    2D echo 11/03/19:  IMPRESSIONS    1. Left ventricular ejection fraction, by estimation, is 60 to 65%. The  left ventricle has normal function. The left ventricle has no regional  wall motion abnormalities. Left ventricular diastolic parameters were  normal.    2. Right ventricular systolic function is normal. The right ventricular  size is normal. There is normal pulmonary artery systolic pressure.  3. The mitral valve is normal in structure. Trivial mitral valve  regurgitation. No evidence of mitral stenosis.  4. The aortic valve is bicuspid. Aortic valve regurgitation is not  visualized. No aortic stenosis is present.  5. The inferior vena cava is normal in size with greater than 50%  respiratory variability, suggesting right atrial pressure of 3 mmHg.    CTA head and neck 11/02/19:  IMPRESSION: Atherosclerotic plaque without hemodynamically significant stenosis in the neck.  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    Assessment/Plan:  1.  Right frontal stroke  The patient overall is generally fairly healthy.  He does have some underlying atherosclerotic changes, the patient may have had a small cholesterol embolus.  The patient will remain on Plavix.  He will follow-up to this office on as-needed basis.  He is actively being monitored with his cholesterol through his primary care physician.  His LDL cholesterol was 130 in the hospital, total cholesterol 207.  HDL was 64.  The LDL cholesterol at this point should be less than 70.  Marlan Palau MD 12/20/2019 9:50 AM  [facility] [address] [place], [place] [address]  Phone [phone] Fax [phone]

## 2019-12-29 ENCOUNTER — Other Ambulatory Visit: Payer: Self-pay | Admitting: Cardiovascular Disease

## 2020-01-05 LAB — LIPID PANEL
Chol/HDL Ratio: 2.4 ratio (ref 0.0–5.0)
Cholesterol, Total: 159 mg/dL (ref 100–199)
HDL: 66 mg/dL (ref >39)
LDL Chol Calc (NIH): 77 mg/dL (ref 0–99)
Triglycerides: 85 mg/dL (ref 0–149)
VLDL Cholesterol Cal: 16 mg/dL (ref 5–40)

## 2020-01-05 LAB — HEPATIC FUNCTION PANEL
ALT: 37 IU/L (ref 0–44)
AST: 41 IU/L — ABNORMAL HIGH (ref 0–40)
Albumin: 4.1 g/dL (ref 3.8–4.8)
Alkaline Phosphatase: 79 IU/L (ref 48–121)
Bilirubin Total: 0.8 mg/dL (ref 0.0–1.2)
Bilirubin, Direct: 0.25 mg/dL (ref 0.00–0.40)
Total Protein: 7.1 g/dL (ref 6.0–8.5)

## 2020-01-11 ENCOUNTER — Telehealth: Payer: Self-pay | Admitting: Cardiovascular Disease

## 2020-01-11 NOTE — Telephone Encounter (Signed)
Pt updated with lab work and reported since MD increased his Crestor to 40 mg daily, he has started experiencing muscle ache. Pt inquiring if MD is ok with decrease dose. Pt also wanted to how long he is to continue with Plavix.  Will route to MD for recommendations.

## 2020-01-11 NOTE — Telephone Encounter (Signed)
Patient is returning call to discuss results from lab work completed on 01/05/20. Please call.

## 2020-01-14 NOTE — Telephone Encounter (Signed)
Ok to reduce dose to 20 mg

## 2020-01-16 MED ORDER — CLOPIDOGREL BISULFATE 75 MG PO TABS: 75.0000 mg | ORAL_TABLET | Freq: Every day | ORAL | 3 refills | Status: AC

## 2020-01-16 NOTE — Telephone Encounter (Signed)
Spoke to patient Dr.Berry advised to decrease Crestor to 20 mg daily.Advised to call back if he continues to have muscle pain.Plavix refill sent to pharmacy.

## 2020-02-07 ENCOUNTER — Telehealth: Payer: Self-pay | Admitting: Cardiovascular Disease

## 2020-02-07 MED ORDER — ROSUVASTATIN CALCIUM 20 MG PO TABS: 20.0000 mg | ORAL_TABLET | Freq: Every day | ORAL | 3 refills | Status: DC

## 2020-02-07 NOTE — Telephone Encounter (Signed)
Pt c/o medication issue:  1. Name of Medication: rosuvastatin (CRESTOR) 20 MG tablet  2. How are you currently taking this medication (dosage and times per day)? 1 tablet (20 mg total) by mouth daily  3. Are you having a reaction (difficulty breathing--STAT)? No  4. What is your medication issue? Patient states he is requesting to speak with Elnita Maxwell. [facility] sent him the incorrect dosage instructions for his medication. He states he has enough to last him the next 2 months, however, he would like to discuss with Elnita Maxwell.

## 2020-02-07 NOTE — Telephone Encounter (Signed)
Spoke to patient he stated he received crestor prescription from [facility] with wrong directions.Stated he is taking 20 mg daily.Advised chart indicates you are taking crestor 20 mg daily.Advised I will send in 90 day refill.

## 2020-03-20 ENCOUNTER — Telehealth: Payer: Self-pay | Admitting: *Deleted

## 2020-03-20 NOTE — Telephone Encounter (Signed)
   Primary Cardiologist: Nanetta Batty, MD  Chart reviewed as part of pre-operative protocol coverage. The patient takes Plavix due to hx of CVA. Left voice mail to call back.  Dr. Allyson Sabal, Can you comment on Plavix?    [place], [place]  03/20/2020, 4:27 PM

## 2020-03-20 NOTE — Telephone Encounter (Signed)
   Primary Cardiologist: Nanetta Batty, MD  Chart reviewed as part of pre-operative protocol coverage. Patient was contacted 03/20/2020 in reference to pre-operative risk assessment for pending surgery as outlined below.  Kyle Hansen was last seen on 11/08/19 by Dr. Allyson Sabal for HLD and family hx of CAD but pt has not been found to have CAD.  He did have CVA and is on plavix for the stroke.   Since that day, Kyle Hansen has done well from cardiac perspective.  Concerning Plavix please check with Dr. Anne Hahn with Neurology.  Therefore, based on ACC/AHA guidelines, the patient would be at acceptable risk for the planned procedure without further cardiovascular testing.   The patient was advised that if he develops new symptoms prior to surgery to contact our office to arrange for a follow-up visit, and he verbalized understanding.  I will route this recommendation to the requesting party via Epic fax function and remove from pre-op pool. Please call with questions.  Nada Boozer, NP 03/20/2020, 4:27 PM

## 2020-03-20 NOTE — Telephone Encounter (Signed)
Please ask Dr. Anne Hahn about Plavix, he is on this for Stroke.  Not CAD.

## 2020-03-20 NOTE — Telephone Encounter (Signed)
   [facility] Pre-operative Risk Assessment    HEARTCARE STAFF: - Please ensure there is not already an duplicate clearance open for this procedure. - Under Visit Info/Reason for Call, type in Other and utilize the format Clearance MM/DD/YY or Clearance TBD. Do not use dashes or single digits. - If request is for dental extraction, please clarify the # of teeth to be extracted.  Request for surgical clearance:  1. What type of surgery is being performed? Colonoscopy    2. When is this surgery scheduled? 05/13/20   3. What type of clearance is required (medical clearance vs. Pharmacy clearance to hold med vs. Both)? both  4. Are there any medications that need to be held prior to surgery and how long?plavix-requesting 3 day before, day of and the day after   5. Practice name and name of physician performing surgery? [facility]   6. What is the office phone number? [phone]   7.   What is the office fax number? [phone]  8.   Anesthesia type (None, local, MAC, general) ? Propofol    Fredia Beets 03/20/2020, 4:02 PM  _________________________________________________________________   (provider comments below)

## 2020-03-21 NOTE — Telephone Encounter (Signed)
Cleared for colonoscopy from a heart point of view.  Agreed, need to check with Dr. Anne Hahn regarding stopping Plavix since this was started because of a stroke.

## 2020-03-21 NOTE — Telephone Encounter (Signed)
Left detailed message cleared for Colonoscopy will need to get medication directions from Neurology(see below)

## 2020-03-21 NOTE — Telephone Encounter (Signed)
   Primary Cardiologist: Nanetta Batty, MD  Chart reviewed as part of pre-operative protocol coverage. Given past medical history and time since last visit, based on ACC/AHA guidelines, CAN LUCCI would be at acceptable risk for the planned procedure without further cardiovascular testing.   Patient will need to receive approval from Dr. Anne Hahn with neurology regarding Plavix.  Patient was advised that if he develops new symptoms prior to surgery to contact our office to arrange a follow-up appointment.  He verbalized understanding.  I will route this recommendation to the requesting party via Epic fax function and remove from pre-op pool.  Please call with questions.  Kyle Hansen. Trey Bebee NP-C    03/21/2020, 2:40 PM [facility] [address] Office [phone] Fax [phone]

## 2020-05-07 ENCOUNTER — Encounter: Payer: Self-pay | Admitting: Cardiovascular Disease

## 2020-05-07 ENCOUNTER — Ambulatory Visit: Payer: Medicare Other | Admitting: Cardiovascular Disease

## 2020-05-07 ENCOUNTER — Other Ambulatory Visit: Payer: Self-pay

## 2020-05-07 VITALS — BP 128/66 | HR 55 | Ht 68.0 in | Wt 166.0 lb

## 2020-05-07 DIAGNOSIS — I63431 Cerebral infarction due to embolism of right posterior cerebral artery: Secondary | ICD-10-CM | POA: Diagnosis not present

## 2020-05-07 DIAGNOSIS — I779 Disorder of arteries and arterioles, unspecified: Secondary | ICD-10-CM

## 2020-05-07 DIAGNOSIS — R001 Bradycardia, unspecified: Secondary | ICD-10-CM

## 2020-05-07 DIAGNOSIS — E785 Hyperlipidemia, unspecified: Secondary | ICD-10-CM

## 2020-05-07 NOTE — Assessment & Plan Note (Signed)
History of CVA 11/02/2019 with dysarthria which resolved as well as some left hand tingling.  Work-up was essentially benign.  Carotid CTA showed atherosclerotic plaque of no hemodynamic significance.  He was placed on Plavix 75 mg a day and is admitted to some easy bruising.  He said no residual or recurrent symptoms.  He did have a transesophageal echo performed by Dr. Flora Lipps 11/20/2019 looking for embolic source which was unrevealing.  An event monitor showed no evidence of A. fib.

## 2020-05-07 NOTE — Assessment & Plan Note (Signed)
History of hyperlipidemia on statin therapy with lipid profile performed 01/05/2020 revealing total cholesterol 59, LDL 77 and HDL of 66.

## 2020-05-07 NOTE — Progress Notes (Signed)
05/07/2020 Kyle Hansen   1955-10-21  [id number]  Primary Physician Kyle Found, MD Primary Cardiologist: Kyle Gess MD Kyle Hansen, [place]  HPI:  Kyle Hansen is a 64 y.o.   thin-appearing, married Caucasian male, father of 2, grandfather to 2 grandchildren who I saw5/05/2020. He is referred to me because of positive risk factors. His wife Kyle Hansen is also a patient of mine, and is accompanying him today. Apparently she was recently diagnosed with breast cancer and underwent surgery for this recently.  She is currently cancer free. He has a history of hyperlipidemia as well as a strong family history for heart disease with a father that had his first MI at 27. His brother had his first MI at age 64 and bypass grafting at 13. He has never had a heart attack or stroke. He denies chest pain or shortness of breath. He had a Myoview stress test performed several years ago in our office which is normal and carotid Dopplers that showed no evidence of ICA stenosis.  He was admitted to [facility] with a CVA 11/02/2019 with some speech impediment which resolved spontaneously.  Also had some left hand tingling.  His work-up was essentially benign.  He was discharged the following day.  He was placed on Plavix.  His statin drug was increased to 40 mg of Crestor daily.  A subsequent transesophageal echo performed by Dr. Flora Hansen to evaluate embolic source on 11/20/2019 was unrevealing.  Event monitor showed no evidence of A. Fib.  Since I saw him 6 months ago he continues to do well.  He has no neurologic symptoms.  He denies chest pain or shortness of breath.  He remains on low-dose aspirin and Plavix.  Current Meds  Medication Sig  . ASPIRIN 81 PO Take by mouth.  . clopidogrel (PLAVIX) 75 MG tablet Take 75 mg by mouth daily.  . Coenzyme Q10 (COQ10) 200 MG CAPS Take 200 mg by mouth daily.   . Multiple Vitamin (MULTIVITAMIN) capsule Take 1 capsule by mouth daily.    .  rosuvastatin (CRESTOR) 20 MG tablet Take 1 tablet (20 mg total) by mouth daily.     Allergies  Allergen Reactions  . Barbiturates Other (See Comments)    Can cause activity of porphyria  . Chloramphenicols Other (See Comments)    Can induce porphyria  . Griseofulvin Other (See Comments)    Can induce porphyria  . Librium Other (See Comments)    Can induce porphyria  . Methsuximide Other (See Comments)    Can induce porphyria  . [place] [Meprobamate] Other (See Comments)    Can induce porphyria  . Nicergoline Other (See Comments)    Can induce porphyria  . Orin[place] [Tolbutamide] Other (See Comments)    Can induce porphyria  . Sulfonamide Derivatives Other (See Comments)    Can induce porphyria  . Tofranil-Pm Other (See Comments)    Can induce porphyria  . Atorvastatin Other (See Comments)    Muscle aches  . Chlordiazepoxide   . Imipramine   . Phenytoin   . Phenytoin Sodium Extended Other (See Comments)    Can induce porphyria  . Sulfa Antibiotics     Social History   Socioeconomic History  . Marital status: Married    Spouse name: Kyle Hansen  . Number of children: 2  . Years of education: Not on file  . Highest education level: Not on file  Occupational History  . Occupation: Retired  Tobacco Use  . Smoking status: Never Smoker  . Smokeless tobacco: Current User  Substance and Sexual Activity  . Alcohol use: No  . Drug use: No  . Sexual activity: Not on file  Other Topics Concern  . Not on file  Social History Narrative  . Not on file   Social Determinants of Health   Financial Resource Strain:   . Difficulty of Paying Living Expenses: Not on file  Food Insecurity:   . Worried About Programme researcher, broadcasting/film/video in the Last Year: Not on file  . Ran Out of Food in the Last Year: Not on file  Transportation Needs:   . Lack of Transportation (Medical): Not on file  . Lack of Transportation (Non-Medical): Not on file  Physical Activity:   . Days of Exercise  per Week: Not on file  . Minutes of Exercise per Session: Not on file  Stress:   . Feeling of Stress : Not on file  Social Connections:   . Frequency of Communication with Friends and Family: Not on file  . Frequency of Social Gatherings with Friends and Family: Not on file  . Attends Religious Services: Not on file  . Active Member of Clubs or Organizations: Not on file  . Attends Banker Meetings: Not on file  . Marital Status: Not on file  Intimate Partner Violence:   . Fear of Current or Ex-Partner: Not on file  . Emotionally Abused: Not on file  . Physically Abused: Not on file  . Sexually Abused: Not on file     Review of Systems: General: negative for chills, fever, night sweats or weight changes.  Cardiovascular: negative for chest pain, dyspnea on exertion, edema, orthopnea, palpitations, paroxysmal nocturnal dyspnea or shortness of breath Dermatological: negative for rash Respiratory: negative for cough or wheezing Urologic: negative for hematuria Abdominal: negative for nausea, vomiting, diarrhea, bright red blood per rectum, melena, or hematemesis Neurologic: negative for visual changes, syncope, or dizziness All other systems reviewed and are otherwise negative except as noted above.    Blood pressure 128/66, pulse (!) 55, height 5\' 8"  (1.727 m), weight 166 lb (75.3 kg).  General appearance: alert and no distress Neck: no adenopathy, no carotid bruit, no JVD, supple, symmetrical, trachea midline and thyroid not enlarged, symmetric, no tenderness/mass/nodules Lungs: clear to auscultation bilaterally Heart: regular rate and rhythm, S1, S2 normal, no murmur, click, rub or gallop Extremities: extremities normal, atraumatic, no cyanosis or edema Pulses: 2+ and symmetric Skin: Skin color, texture, turgor normal. No rashes or lesions Neurologic: Alert and oriented X 3, normal strength and tone. Normal symmetric reflexes. Normal coordination and gait  EKG  sinus bradycardia 55 without ST or T wave changes.  Personally reviewed this EKG.  ASSESSMENT AND PLAN:   Hyperlipidemia with target LDL less than 70 History of hyperlipidemia on statin therapy with lipid profile performed 01/05/2020 revealing total cholesterol 59, LDL 77 and HDL of 66.  Mild carotid artery disease (HCC) Atherosclerotic plaque seen on carotid CTA 11/02/2019 without hemodynamic significance.  His last carotid Doppler study was performed in 2013.  We will repeat this as a baseline.  CVA (cerebral vascular accident) (HCC) History of CVA 11/02/2019 with dysarthria which resolved as well as some left hand tingling.  Work-up was essentially benign.  Carotid CTA showed atherosclerotic plaque of no hemodynamic significance.  He was placed on Plavix 75 mg a day and is admitted to some easy bruising.  He said no residual or recurrent  symptoms.  He did have a transesophageal echo performed by Dr. Flora Hansen 11/20/2019 looking for embolic source which was unrevealing.  An event monitor showed no evidence of A. fib.      Kyle Gess MD FACP,FACC,FAHA, [facility] 05/07/2020 9:03 AM

## 2020-05-07 NOTE — Assessment & Plan Note (Signed)
Atherosclerotic plaque seen on carotid CTA 11/02/2019 without hemodynamic significance.  His last carotid Doppler study was performed in 2013.  We will repeat this as a baseline.

## 2020-05-07 NOTE — Patient Instructions (Addendum)
Medication Instructions:  Your physician recommends that you continue on your current medications as directed. Please refer to the Current Medication list given to you today.  *If you need a refill on your cardiac medications before your next appointment, please call your pharmacy*  Testing/Procedures: Your physician has requested that you have a carotid duplex. This test is an ultrasound of the carotid arteries in your neck. It looks at blood flow through these arteries that supply the brain with blood. Allow one hour for this exam. There are no restrictions or special instructions. [address].      Follow-Up: At [facility], you and your health needs are our priority.  As part of our continuing mission to provide you with exceptional heart care, we have created designated Provider Care Teams.  These Care Teams include your primary Cardiologist (physician) and Advanced Practice Providers (APPs -  Physician Assistants and Nurse Practitioners) who all work together to provide you with the care you need, when you need it.  We recommend signing up for the patient portal called "MyChart".  Sign up information is provided on this After Visit Summary.  MyChart is used to connect with patients for Virtual Visits (Telemedicine).  Patients are able to view lab/test results, encounter notes, upcoming appointments, etc.  Non-urgent messages can be sent to your provider as well.   To learn more about what you can do with MyChart, go to [email or web address].com.    Your next appointment:   12 month(s)  The format for your next appointment:   In Person  Provider:   Jonathan Berry, MD  

## 2020-05-09 ENCOUNTER — Ambulatory Visit (HOSPITAL_COMMUNITY)
Admission: RE | Admit: 2020-05-09 | Discharge: 2020-05-09 | Disposition: A | Discharge status: Home / Self Care | Payer: Medicare Other | Source: Ambulatory Visit | Attending: Cardiology | Admitting: Cardiology

## 2020-05-09 ENCOUNTER — Other Ambulatory Visit: Payer: Self-pay

## 2020-05-09 DIAGNOSIS — I779 Disorder of arteries and arterioles, unspecified: Secondary | ICD-10-CM | POA: Insufficient documentation

## 2020-05-09 DIAGNOSIS — I63431 Cerebral infarction due to embolism of right posterior cerebral artery: Secondary | ICD-10-CM | POA: Diagnosis present

## 2020-05-15 ENCOUNTER — Other Ambulatory Visit: Payer: Self-pay

## 2020-05-15 DIAGNOSIS — I779 Disorder of arteries and arterioles, unspecified: Secondary | ICD-10-CM

## 2020-08-13 ENCOUNTER — Telehealth: Payer: Self-pay | Admitting: Cardiovascular Disease

## 2020-08-13 NOTE — Telephone Encounter (Signed)
New message:    Pt said he wanted Dr Allyson Sabal to know that in the last 3 weeks, he have had 3 nosebleeds. He said last night it lasted for about 20 minutes,

## 2020-08-13 NOTE — Telephone Encounter (Signed)
Returned call to patient of Dr. Allyson Sabal who has had 3 nosebleeds over 3 weeks. He had a nose bleed last night, that woke him up. The bleeding lasted about 20 minutes.   Advised to try humidifer and/or OTC nasal saline spray to keep sinuses from drying out, especially in winter. He has not done this before.   Advised to monitor symptoms, if recurrent nosebleeds, may need ENT consult

## 2020-11-21 ENCOUNTER — Other Ambulatory Visit: Payer: Self-pay | Admitting: Cardiovascular Disease

## 2021-02-12 ENCOUNTER — Telehealth: Payer: Self-pay | Admitting: Cardiovascular Disease

## 2021-02-12 NOTE — Telephone Encounter (Signed)
Returned pt's call, pt reports he had a stroke in May of 2021, and recently found he had an insurance policy that he could make a claim with in case of stroke. Pt reports he is attempting to make a claim at this time but needs the signature of a physician. Pt is not sure if the physician needs to be a PCP, cardiologist, or the hospitalist who saw him during his admission. Advised pt to call his insurance company to find out which physician needs to sign the form. Advised pt if he needs his cardiologist to sign it he may call back and/or fax the form to our office. Pt reports he will bring the form by if needed and call for any other concerns. All questions/concerns addressed at this time.

## 2021-02-12 NOTE — Telephone Encounter (Signed)
Pt is calling with concerns he had a stroke on last year and his insurance is requesting a form be filled out.The pt is stating he does not know where to go to get the forms filled out since it was so long ago

## 2021-02-14 NOTE — Telephone Encounter (Signed)
Pts wife called in stating that they dropped the forms off yesterday and would like to know if they are ready for pick up... please advise

## 2021-02-18 ENCOUNTER — Telehealth: Payer: Self-pay | Admitting: Cardiovascular Disease

## 2021-02-18 NOTE — Telephone Encounter (Signed)
Left a message to call back.

## 2021-02-18 NOTE — Telephone Encounter (Signed)
Patient states he is returning a call. He assumes it is regarding the forms he dropped off. Please advise.

## 2021-02-18 NOTE — Telephone Encounter (Signed)
VM left for patient, form for Dr.Berry that has been completed is at the front desk at [facility] office.

## 2021-03-16 ENCOUNTER — Other Ambulatory Visit: Payer: Self-pay

## 2021-03-16 ENCOUNTER — Emergency Department (HOSPITAL_BASED_OUTPATIENT_CLINIC_OR_DEPARTMENT_OTHER)
Admission: EM | Admit: 2021-03-16 | Discharge: 2021-03-16 | Disposition: A | Discharge status: Home / Self Care | Payer: Medicare (Managed Care) | Attending: Emergency Medicine | Admitting: Emergency Medicine

## 2021-03-16 ENCOUNTER — Encounter (HOSPITAL_BASED_OUTPATIENT_CLINIC_OR_DEPARTMENT_OTHER): Payer: Self-pay | Admitting: Emergency Medicine

## 2021-03-16 ENCOUNTER — Emergency Department (HOSPITAL_BASED_OUTPATIENT_CLINIC_OR_DEPARTMENT_OTHER): Payer: Medicare (Managed Care)

## 2021-03-16 DIAGNOSIS — Z7982 Long term (current) use of aspirin: Secondary | ICD-10-CM | POA: Insufficient documentation

## 2021-03-16 DIAGNOSIS — H1131 Conjunctival hemorrhage, right eye: Secondary | ICD-10-CM | POA: Diagnosis not present

## 2021-03-16 DIAGNOSIS — F17220 Nicotine dependence, chewing tobacco, uncomplicated: Secondary | ICD-10-CM | POA: Diagnosis not present

## 2021-03-16 DIAGNOSIS — H5789 Other specified disorders of eye and adnexa: Secondary | ICD-10-CM | POA: Diagnosis present

## 2021-03-16 DIAGNOSIS — W228XXA Striking against or struck by other objects, initial encounter: Secondary | ICD-10-CM | POA: Diagnosis not present

## 2021-03-16 MED ORDER — FLUORESCEIN SODIUM 1 MG OP STRP
1.0000 | ORAL_STRIP | Freq: Once | OPHTHALMIC | Status: DC
  Filled 2021-03-16: qty 1

## 2021-03-16 MED ORDER — OFLOXACIN 0.3 % OP SOLN: 1.0000 [drp] | Freq: Four times a day (QID) | OPHTHALMIC | 0 refills | Status: DC

## 2021-03-16 MED ORDER — TETRACAINE HCL 0.5 % OP SOLN
1.0000 [drp] | Freq: Once | OPHTHALMIC | Status: DC
  Filled 2021-03-16: qty 4

## 2021-03-16 NOTE — ED Provider Notes (Signed)
[facility] EMERGENCY DEPT Provider Note   CSN: [id number] Arrival date & time: 03/16/21  1800     History Chief Complaint  Patient presents with   Eye Injury    Kyle Hansen is a 65 y.o. male.  Patient states that earlier today he was pushing away a branch when it swung back and one of the twigs hit him in the right eye.  He noticed bleeding in that area and presents to the ER.  Otherwise describes very minimal discomfort, no vision change, no loss of consciousness no fever no cough no vomiting or diarrhea.      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   CVA (cerebral vascular accident) (HCC) 11/02/2019   Total bilirubin, elevated 11/02/2019   Sinus bradycardia 11/02/2019   Cough 04/08/2013   Family history of premature CAD    Hyperlipidemia with target LDL less than 70    Mild carotid artery disease (HCC) 11/28/2010    Past Surgical History:  Procedure Laterality Date   BUBBLE STUDY  11/20/2019   Procedure: BUBBLE STUDY;  Surgeon: Sande Rives, MD;  Location: [facility] ENDOSCOPY;  Service: Cardiovascular;;   COLONOSCOPY     FOOT ARTHRODESIS  2009   lt   HERNIA REPAIR  1993   MASS EXCISION  06/16/2011   Procedure: EXCISION MASS;  Surgeon: Wyn Forster., MD;  Location: [facility];  Service: Orthopedics;  Laterality: Left;  left elbow, and posterior interosseus nerve decompression with injection of right olecranon bursa   ORIF CALCANEOUS FRACTURE  2008   lt   PIP JOINT FUSION  2006   lt   SHOULDER ARTHROSCOPY  2008   lt   TEE  WITHOUT CARDIOVERSION N/A 11/20/2019   Procedure: TRANSESOPHAGEAL ECHOCARDIOGRAM (TEE);  Surgeon: Sande Rives, MD;  Location: [facility] ENDOSCOPY;  Service: Cardiovascular;  Laterality: N/A;       Family History  Problem Relation Age of Onset   Heart attack Father    Heart disease Father    Hyperlipidemia Father    Heart attack Brother    Heart disease Brother    Hyperlipidemia Brother     Social History   Tobacco Use   Smoking status: Never   Smokeless tobacco: Current  Substance Use Topics   Alcohol use: No   Drug use: No    Home Medications Prior to Admission medications   Medication Sig Start Date End Date Taking? Authorizing Provider  ASPIRIN 81 PO Take by mouth.    [provider]  clopidogrel (PLAVIX) 75 MG tablet TAKE 1 TABLET BY MOUTH  DAILY 11/22/20   Runell Gess, MD  Coenzyme Q10 (COQ10) 200 MG CAPS Take 200 mg by mouth daily.     [provider]  Multiple Vitamin (MULTIVITAMIN) capsule Take 1 capsule by mouth daily.      [provider]  rosuvastatin (CRESTOR) 20 MG tablet TAKE 1 TABLET BY MOUTH  DAILY 11/22/20   Runell Gess, MD    Allergies  Barbiturates, Chloramphenicols, Griseofulvin, Librium, Methsuximide, [place] [meprobamate], Nicergoline, Orinase [tolbutamide], Sulfonamide derivatives, Tofranil-pm, Atorvastatin, Chlordiazepoxide, Imipramine, Phenytoin, Phenytoin sodium extended, and Sulfa antibiotics  Review of Systems   Review of Systems  Constitutional:  Negative for fever.  HENT:  Negative for ear pain and sore throat.   Eyes:  Positive for redness. Negative for pain.  Respiratory:  Negative for cough.   Cardiovascular:  Negative for chest pain.  Gastrointestinal:  Negative for abdominal pain.  Genitourinary:  Negative for flank pain.  Musculoskeletal:  Negative for back pain.  Skin:  Negative for color change and rash.  Neurological:  Negative for syncope.  All other systems reviewed and are  negative.  Physical Exam Updated Vital Signs BP 137/64   Pulse (!) 42   Temp 98.1 F (36.7 C)   Resp 20   SpO2 100%   Physical Exam Constitutional:      Appearance: He is well-developed.  HENT:     Head: Normocephalic.     Nose: Nose normal.  Eyes:     Extraocular Movements: Extraocular movements intact.     Pupils: Pupils are equal, round, and reactive to light.     Comments: Right eye lateral subconjunctival hemorrhage noted.  There is a lesion in the lateral aspect of the right conjunctiva, unclear if that is a old pinguicula or if it is a new lesion from the trauma he sustained.  There is no fluorescein uptake.  Seidel sign is negative.  Cardiovascular:     Rate and Rhythm: Normal rate.  Pulmonary:     Effort: Pulmonary effort is normal.  Skin:    Coloration: Skin is not jaundiced.  Neurological:     Mental Status: He is alert. Mental status is at baseline.       ED Results / Procedures / Treatments   Labs (all labs ordered are listed, but only abnormal results are displayed) Labs Reviewed - No data to display  EKG None  Radiology CT Orbits Wo Contrast  Result Date: 03/16/2021 CLINICAL DATA:  Orbital trauma R eye injury. Pt was doing yard work, bent down and hit a limb . Limb hit him in the right eye. Right eye is red and painful. EXAM: CT ORBITS WITHOUT CONTRAST TECHNIQUE: Multidetector CT imaging of the orbits was performed using the standard protocol without intravenous contrast. Multiplanar CT image reconstructions were also generated. COMPARISON:  MR head 11/02/2019, CT head 11/02/19 FINDINGS: Orbits: No orbital mass or evidence of inflammation. Punctate calcification along the left retina likely nontraumatic and benign. Normal appearance of the globes, optic nerve-sheath complexes, extraocular muscles, orbital fat and lacrimal glands. Visible paranasal sinuses: Clear. Soft tissues: Normal. Osseous: No fracture or aggressive lesion. Limited intracranial: No acute  or significant finding. Atherosclerotic calcifications are present within the cavernous internal carotid arteries. IMPRESSION: No retained radiopaque foreign body. Electronically Signed   By: Tish Frederickson M.D.   On: 03/16/2021 19:39    Procedures Procedures   Medications Ordered in ED Medications  tetracaine (PONTOCAINE) 0.5 % ophthalmic solution 1 drop (has no administration in time range)  fluorescein ophthalmic strip 1 strip (has no administration in time range)    ED Course  I have reviewed the triage vital signs and the nursing notes.  Pertinent labs & imaging results that were available during my care of the patient were reviewed by me and considered in my medical decision making (see chart for details).    MDM Rules/Calculators/A&P  Case discussed with ophthalmologist on-call, feels a lesion is consistent with penguicula, recommending outpatient follow-up.  CT imaging is negative for acuity 20/20 both eyes.  Patient agreeable to plan, given follow-up for admission to call make an appointment within the next 2 to 4 days.  Advised immediate return for vision change pain or any additional concerns.  Final Clinical Impression(s) / ED Diagnoses Final diagnoses:  Subconjunctival hemorrhage of right eye    Rx / DC Orders ED Discharge Orders     None        Cheryll Cockayne, MD 03/16/21 2004

## 2021-03-16 NOTE — ED Triage Notes (Signed)
Pt was doing yard work, bent down and hit a limb . Limb hit him in the right eye. Right eye is red and painful.

## 2021-03-16 NOTE — Discharge Instructions (Signed)
Follow-up with your ophthalmologist within this week.  If you cannot get a hold of your ophthalmologist you can also follow-up with Dr.Delmonte.  You will need to call to make an appointment.  Return immediately to the ER if you have vision change pain fevers or any additional concerns.

## 2021-04-14 ENCOUNTER — Telehealth: Payer: Self-pay | Admitting: Cardiovascular Disease

## 2021-04-14 NOTE — Telephone Encounter (Signed)
*  STAT* If patient is at the pharmacy, call can be transferred to refill team.   1. Which medications need to be refilled? (please list name of each medication and dose if known)  clopidogrel (PLAVIX) 75 MG tablet   2. Which pharmacy/location (including street and city if local pharmacy) is medication to be sent to? [id number] - [place], [place] - [address]   3. Do they need a 30 day or 90 day supply?   30 day supply

## 2021-04-21 MED ORDER — CLOPIDOGREL BISULFATE 75 MG PO TABS: 75.0000 mg | ORAL_TABLET | Freq: Every day | ORAL | 0 refills | Status: DC

## 2021-04-21 NOTE — Addendum Note (Signed)
Addended by: Dorris Fetch on: 04/21/2021 11:08 AM   Modules accepted: Orders

## 2021-05-07 ENCOUNTER — Encounter: Payer: Self-pay | Admitting: Cardiovascular Disease

## 2021-05-07 ENCOUNTER — Other Ambulatory Visit: Payer: Self-pay

## 2021-05-07 ENCOUNTER — Ambulatory Visit: Payer: Medicare (Managed Care) | Admitting: Cardiovascular Disease

## 2021-05-07 VITALS — BP 130/76 | HR 52 | Ht 68.0 in | Wt 167.2 lb

## 2021-05-07 DIAGNOSIS — R001 Bradycardia, unspecified: Secondary | ICD-10-CM | POA: Diagnosis not present

## 2021-05-07 DIAGNOSIS — E785 Hyperlipidemia, unspecified: Secondary | ICD-10-CM

## 2021-05-07 LAB — HEPATIC FUNCTION PANEL
ALT: 23 IU/L (ref 0–44)
AST: 33 IU/L (ref 0–40)
Albumin: 4.3 g/dL (ref 3.8–4.8)
Alkaline Phosphatase: 81 IU/L (ref 44–121)
Bilirubin Total: 0.9 mg/dL (ref 0.0–1.2)
Bilirubin, Direct: 0.27 mg/dL (ref 0.00–0.40)
Total Protein: 7.2 g/dL (ref 6.0–8.5)

## 2021-05-07 LAB — LIPID PANEL
Chol/HDL Ratio: 2.4 ratio (ref 0.0–5.0)
Cholesterol, Total: 164 mg/dL (ref 100–199)
HDL: 68 mg/dL (ref >39)
LDL Chol Calc (NIH): 85 mg/dL (ref 0–99)
Triglycerides: 55 mg/dL (ref 0–149)
VLDL Cholesterol Cal: 11 mg/dL (ref 5–40)

## 2021-05-07 NOTE — Progress Notes (Signed)
05/07/2021 EMERZON VANDERWOOD   05/13/1956  [id number]  Primary Physician Nickola Major, MD Primary Cardiologist: Lorretta Harp MD Lupe Carney, [place]  HPI:  Kyle Hansen is a 65 y.o.  thin-appearing, married Caucasian male, father of 2, grandfather to 2 grandchildren who I saw 05/07/2020. He is referred to me because of positive risk factors. His wife [name] is also a patient of mine, and is accompanying him today.  Apparently she was recently diagnosed with breast cancer and underwent surgery for this recently.  She is currently cancer free.  He has a history of hyperlipidemia as well as a strong family history for heart disease with a father that had his first MI at 16. His brother had his first MI at age 44 and bypass grafting at 110. He has never had a heart attack or stroke. He denies chest pain or shortness of breath. He had a Myoview stress test performed several years ago in our office which is normal and carotid Dopplers that showed no evidence of ICA stenosis.   He was admitted to [facility] with a CVA 11/02/2019 with some speech impediment which resolved spontaneously.  Also had some left hand tingling.  His work-up was essentially benign.  He was discharged the following day.  He was placed on Plavix.  His statin drug was increased to 40 mg of Crestor daily.  A subsequent transesophageal echo performed by Dr. Audie Box to evaluate embolic source on 0000000 was unrevealing.  Event monitor showed no evidence of A. Fib.   Since I saw him 6 months ago he continues to do well.  He has no neurologic symptoms.  He denies chest pain or shortness of breath.  He remains on low-dose aspirin and Plavix, as well as a statin drug.  He did come down with COVID this past June and lost sense of taste and smell over this is completely resolved.  He works out in [employer] 3 days a week lifting weights for 2 hours and is asymptomatic.     Current Meds  Medication Sig   ASPIRIN 81  PO Take by mouth.   clopidogrel (PLAVIX) 75 MG tablet Take 1 tablet (75 mg total) by mouth daily.   Coenzyme Q10 (COQ10) 200 MG CAPS Take 200 mg by mouth daily.    Multiple Vitamin (MULTIVITAMIN) capsule Take 1 capsule by mouth daily.     ofloxacin (OCUFLOX) 0.3 % ophthalmic solution Place 1 drop into the right eye 4 (four) times daily.   rosuvastatin (CRESTOR) 20 MG tablet TAKE 1 TABLET BY MOUTH  DAILY     Allergies  Allergen Reactions   Barbiturates Other (See Comments)    Can cause activity of porphyria   Chloramphenicols Other (See Comments)    Can induce porphyria   Griseofulvin Other (See Comments)    Can induce porphyria   Librium Other (See Comments)    Can induce porphyria   Methsuximide Other (See Comments)    Can induce porphyria   [place] [Meprobamate] Other (See Comments)    Can induce porphyria   Nicergoline Other (See Comments)    Can induce porphyria   Orinase [Tolbutamide] Other (See Comments)    Can induce porphyria   Sulfonamide Derivatives Other (See Comments)    Can induce porphyria   Tofranil-Pm Other (See Comments)    Can induce porphyria   Atorvastatin Other (See Comments)    Muscle aches   Chlordiazepoxide    Imipramine  Phenytoin    Phenytoin Sodium Extended Other (See Comments)    Can induce porphyria   Sulfa Antibiotics     Social History   Socioeconomic History   Marital status: Married    Spouse name: Jacinto Keil   Number of children: 2   Years of education: Not on file   Highest education level: Not on file  Occupational History   Occupation: Retired  Tobacco Use   Smoking status: Never   Smokeless tobacco: Current  Substance and Sexual Activity   Alcohol use: No   Drug use: No   Sexual activity: Not on file  Other Topics Concern   Not on file  Social History Narrative   Not on file   Social Determinants of Health   Financial Resource Strain: Not on file  Food Insecurity: Not on file  Transportation Needs: Not on  file  Physical Activity: Not on file  Stress: Not on file  Social Connections: Not on file  Intimate Partner Violence: Not on file     Review of Systems: General: negative for chills, fever, night sweats or weight changes.  Cardiovascular: negative for chest pain, dyspnea on exertion, edema, orthopnea, palpitations, paroxysmal nocturnal dyspnea or shortness of breath Dermatological: negative for rash Respiratory: negative for cough or wheezing Urologic: negative for hematuria Abdominal: negative for nausea, vomiting, diarrhea, bright red blood per rectum, melena, or hematemesis Neurologic: negative for visual changes, syncope, or dizziness All other systems reviewed and are otherwise negative except as noted above.    Blood pressure 130/76, pulse (!) 52, height 5\' 8"  (1.727 m), weight 167 lb 3.2 oz (75.8 kg), SpO2 99 %.  General appearance: alert and no distress Neck: no adenopathy, no carotid bruit, no JVD, supple, symmetrical, trachea midline, and thyroid not enlarged, symmetric, no tenderness/mass/nodules Lungs: clear to auscultation bilaterally Heart: regular rate and rhythm, S1, S2 normal, no murmur, click, rub or gallop Extremities: extremities normal, atraumatic, no cyanosis or edema Pulses: 2+ and symmetric Skin: Skin color, texture, turgor normal. No rashes or lesions Neurologic: Grossly normal  EKG sinus bradycardia 52 without ST or T wave changes.  I personally reviewed this EKG.  ASSESSMENT AND PLAN:   Hyperlipidemia with target LDL less than 70 History of hyperlipidemia on statin therapy with lipid profile performed 01/05/2020 revealing total cholesterol of 59, LDL 77 and HDL 66.  We will recheck a lipid liver profile today.  Slow heart rate Heart rate is in the 50s today but at times in the 40s at home.  He is on no negative chronotropic agents.  I suspect this is his normal resting heart rate probably because he is in good cardiovascular shape.     03/07/2020 MD FACP,FACC,FAHA, [facility] 05/07/2021 9:25 AM

## 2021-05-07 NOTE — Assessment & Plan Note (Signed)
Heart rate is in the 50s today but at times in the 40s at home.  He is on no negative chronotropic agents.  I suspect this is his normal resting heart rate probably because he is in good cardiovascular shape.

## 2021-05-07 NOTE — Patient Instructions (Signed)

## 2021-05-07 NOTE — Assessment & Plan Note (Signed)
History of hyperlipidemia on statin therapy with lipid profile performed 01/05/2020 revealing total cholesterol of 59, LDL 77 and HDL 66.  We will recheck a lipid liver profile today.

## 2021-05-09 ENCOUNTER — Encounter: Payer: Self-pay | Admitting: *Deleted

## 2021-05-15 ENCOUNTER — Ambulatory Visit (HOSPITAL_COMMUNITY)
Admission: RE | Admit: 2021-05-15 | Discharge: 2021-05-15 | Disposition: A | Discharge status: Home / Self Care | Payer: Medicare (Managed Care) | Source: Ambulatory Visit | Attending: Cardiovascular Disease | Admitting: Cardiovascular Disease

## 2021-05-15 ENCOUNTER — Other Ambulatory Visit: Payer: Self-pay

## 2021-05-15 DIAGNOSIS — I6523 Occlusion and stenosis of bilateral carotid arteries: Secondary | ICD-10-CM

## 2021-05-15 DIAGNOSIS — E785 Hyperlipidemia, unspecified: Secondary | ICD-10-CM | POA: Insufficient documentation

## 2021-05-15 DIAGNOSIS — I779 Disorder of arteries and arterioles, unspecified: Secondary | ICD-10-CM | POA: Diagnosis not present

## 2021-05-15 DIAGNOSIS — Z8673 Personal history of transient ischemic attack (TIA), and cerebral infarction without residual deficits: Secondary | ICD-10-CM | POA: Insufficient documentation

## 2021-07-02 ENCOUNTER — Telehealth: Payer: Self-pay | Admitting: Cardiovascular Disease

## 2021-07-02 MED ORDER — CLOPIDOGREL BISULFATE 75 MG PO TABS: 75.0000 mg | ORAL_TABLET | Freq: Every day | ORAL | 3 refills | Status: DC

## 2021-07-02 NOTE — Telephone Encounter (Signed)
°*  STAT* If patient is at the pharmacy, call can be transferred to refill team.   1. Which medications need to be refilled? (please list name of each medication and dose if known)  rosuvastatin (CRESTOR) 20 MG tablet clopidogrel (PLAVIX) 75 MG tablet  2. Which pharmacy/location (including street and city if local pharmacy) is medication to be sent to?  [facility]) - [place], [place] - [address] [place]  3. Do they need a 30 day or 90 day supply? 90 day

## 2021-07-07 ENCOUNTER — Other Ambulatory Visit: Payer: Self-pay

## 2021-07-07 ENCOUNTER — Emergency Department (HOSPITAL_BASED_OUTPATIENT_CLINIC_OR_DEPARTMENT_OTHER)
Admission: EM | Admit: 2021-07-07 | Discharge: 2021-07-08 | Disposition: A | Discharge status: Home / Self Care | Payer: Medicare Other | Attending: Emergency Medicine | Admitting: Emergency Medicine

## 2021-07-07 ENCOUNTER — Encounter (HOSPITAL_BASED_OUTPATIENT_CLINIC_OR_DEPARTMENT_OTHER): Payer: Self-pay

## 2021-07-07 ENCOUNTER — Telehealth: Payer: Self-pay | Admitting: Cardiovascular Disease

## 2021-07-07 DIAGNOSIS — R040 Epistaxis: Secondary | ICD-10-CM | POA: Diagnosis present

## 2021-07-07 DIAGNOSIS — Z7982 Long term (current) use of aspirin: Secondary | ICD-10-CM | POA: Diagnosis not present

## 2021-07-07 DIAGNOSIS — I2510 Atherosclerotic heart disease of native coronary artery without angina pectoris: Secondary | ICD-10-CM | POA: Diagnosis not present

## 2021-07-07 NOTE — Telephone Encounter (Signed)
°*  STAT* If patient is at the pharmacy, call can be transferred to refill team.   1. Which medications need to be refilled? (please list name of each medication and dose if known) rosuvastatin (CRESTOR) 20 MG tablet  2. Which pharmacy/location (including street and city if local pharmacy) is medication to be sent to? [facility]) - [place], [place] - [address] [place]  3. Do they need a 30 day or 90 day supply? 90

## 2021-07-07 NOTE — ED Triage Notes (Signed)
Pt arrives POV with his wife.  States he blew his nose earlier tonight causing nosebleed.   He has been holding pressure for about 40 minutes but nose continues to bleed. Pt takes Plavix and Aspirin 81 mg daily.

## 2021-07-08 ENCOUNTER — Other Ambulatory Visit: Payer: Self-pay

## 2021-07-08 LAB — CBC WITH DIFFERENTIAL/PLATELET
Abs Immature Granulocytes: 0.02 10*3/uL (ref 0.00–0.07)
Basophils Absolute: 0 10*3/uL (ref 0.0–0.1)
Basophils Relative: 0 %
Eosinophils Absolute: 0 10*3/uL (ref 0.0–0.5)
Eosinophils Relative: 0 %
HCT: 39.1 % (ref 39.0–52.0)
Hemoglobin: 13.7 g/dL (ref 13.0–17.0)
Immature Granulocytes: 0 %
Lymphocytes Relative: 16 %
Lymphs Abs: 1.7 10*3/uL (ref 0.7–4.0)
MCH: 32.4 pg (ref 26.0–34.0)
MCHC: 35 g/dL (ref 30.0–36.0)
MCV: 92.4 fL (ref 80.0–100.0)
Monocytes Absolute: 1.3 10*3/uL — ABNORMAL HIGH (ref 0.1–1.0)
Monocytes Relative: 12 %
Neutro Abs: 7.4 10*3/uL (ref 1.7–7.7)
Neutrophils Relative %: 72 %
Platelets: 178 10*3/uL (ref 150–400)
RBC: 4.23 MIL/uL (ref 4.22–5.81)
RDW: 12.6 % (ref 11.5–15.5)
WBC: 10.4 10*3/uL (ref 4.0–10.5)
nRBC: 0 % (ref 0.0–0.2)

## 2021-07-08 LAB — BASIC METABOLIC PANEL
Anion gap: 7 (ref 5–15)
BUN: 24 mg/dL — ABNORMAL HIGH (ref 8–23)
CO2: 24 mmol/L (ref 22–32)
Calcium: 8.8 mg/dL — ABNORMAL LOW (ref 8.9–10.3)
Chloride: 104 mmol/L (ref 98–111)
Creatinine, Ser: 0.93 mg/dL (ref 0.61–1.24)
GFR, Estimated: >60 mL/min (ref >60)
Glucose, Bld: 121 mg/dL — ABNORMAL HIGH (ref 70–99)
Potassium: 4.2 mmol/L (ref 3.5–5.1)
Sodium: 135 mmol/L (ref 135–145)

## 2021-07-08 MED ORDER — OXYCODONE-ACETAMINOPHEN 5-325 MG PO TABS: 1.0000 | ORAL_TABLET | Freq: Four times a day (QID) | ORAL | 0 refills | Status: DC | PRN

## 2021-07-08 MED ORDER — AMOXICILLIN 500 MG PO CAPS: 1000.0000 mg | ORAL_CAPSULE | Freq: Two times a day (BID) | ORAL | 0 refills | Status: DC

## 2021-07-08 MED ORDER — OXYCODONE-ACETAMINOPHEN 5-325 MG PO TABS
1.0000 | ORAL_TABLET | Freq: Once | ORAL | Status: AC
  Administered 2021-07-08: 1 via ORAL
  Filled 2021-07-08: qty 1

## 2021-07-08 MED ORDER — LACTATED RINGERS IV BOLUS
1000.0000 mL | Freq: Once | INTRAVENOUS | Status: AC
  Administered 2021-07-08: 1000 mL via INTRAVENOUS

## 2021-07-08 MED ORDER — ROSUVASTATIN CALCIUM 20 MG PO TABS: 20.0000 mg | ORAL_TABLET | Freq: Every day | ORAL | 2 refills | Status: DC

## 2021-07-08 MED ORDER — TRANEXAMIC ACID 1000 MG/10ML IV SOLN
500.0000 mg | Freq: Once | INTRAVENOUS | Status: AC
  Administered 2021-07-08: 500 mg via TOPICAL
  Filled 2021-07-08: qty 10

## 2021-07-08 NOTE — Discharge Instructions (Signed)
Keep the Rapid Rhino in place until you are seen in follow-up in 3-5 days.  If you have recurrence of bleeding, please go to the emergency department at [facility].

## 2021-07-08 NOTE — ED Provider Notes (Signed)
[facility] EMERGENCY DEPT Provider Note   CSN: [id number] Arrival date & time: 07/07/21  2225     History  Chief Complaint  Patient presents with   Epistaxis    Kyle Hansen is a 66 y.o. male.  The history is provided by the patient.  Epistaxis He has history of coronary artery disease, carotid artery disease, stroke, acute intermittent porphyria, hyperlipidemia and comes in because of nosebleed.  He has been treated for sinusitis and he blew his nose tonight.  Following that, he had bleeding from the right nostril which was not able to be controlled with pressure at home.  He is on aspirin and clopidogrel at home.  He denies any other trauma.  He is not on any systemic anticoagulants.   Home Medications Prior to Admission medications   Medication Sig Start Date End Date Taking? Authorizing Provider  ASPIRIN 81 PO Take by mouth.   Yes [provider]  clopidogrel (PLAVIX) 75 MG tablet Take 1 tablet (75 mg total) by mouth daily. 07/02/21  Yes Runell Gess, MD  Coenzyme Q10 (COQ10) 200 MG CAPS Take 200 mg by mouth daily.    Yes [provider]  Multiple Vitamin (MULTIVITAMIN) capsule Take 1 capsule by mouth daily.     Yes [provider]  rosuvastatin (CRESTOR) 20 MG tablet TAKE 1 TABLET BY MOUTH  DAILY 11/22/20  Yes Runell Gess, MD  ofloxacin (OCUFLOX) 0.3 % ophthalmic solution Place 1 drop into the right eye 4 (four) times daily. Patient not taking: Reported on 07/07/2021 03/16/21   Cheryll Cockayne, MD      Allergies    Barbiturates, Chloramphenicols, Griseofulvin, Librium, Methsuximide, [place] [meprobamate], Nicergoline, Orinase [tolbutamide], Sulfonamide derivatives, Tofranil-pm, Atorvastatin, Chlordiazepoxide, Imipramine, Phenytoin, Phenytoin sodium extended, and Sulfa antibiotics    Review of Systems   Review of Systems  HENT:  Positive for nosebleeds.   All other systems reviewed and are negative.  Physical  Exam Updated Vital Signs BP (!) 150/87    Pulse 62    Temp 98 F (36.7 C)    Resp 16    Ht 5\' 8"  (1.727 m)    Wt 73.9 kg    SpO2 100%    BMI 24.78 kg/m  Physical Exam Vitals and nursing note reviewed.  66 year old male, resting comfortably and in no acute distress. Vital signs are significant for elevated blood pressure. Oxygen saturation is 100%, which is normal. Head is normocephalic and atraumatic. PERRLA, EOMI. Right nostril is occluded by a clot.  When that is removed, bleeding site is not obvious but there is a small perforation in the septum.  There is no bleeding in the left nostril.  No definite bleeding site is seen on the right, but blood will drip down and it seems like it might be coming from the septal perforation but may be above that. Neck is nontender and supple without adenopathy. Lungs are clear without rales, wheezes, or rhonchi. Chest is nontender. Heart has regular rate and rhythm without murmur. Abdomen is soft, flat, nontender. Extremities have no cyanosis or edema, full range of motion is present. Skin is warm and dry without rash. Neurologic: Mental status is normal, cranial nerves are intact, moves all extremities equally.  ED Results / Procedures / Treatments   Labs (all labs ordered are listed, but only abnormal results are displayed) Labs Reviewed  BASIC METABOLIC PANEL - Abnormal; Notable for the following components:      Result Value  Glucose, Bld 121 (*)    BUN 24 (*)    Calcium 8.8 (*)    All other components within normal limits  CBC WITH DIFFERENTIAL/PLATELET - Abnormal; Notable for the following components:   Monocytes Absolute 1.3 (*)    All other components within normal limits    EKG EKG Interpretation  Date/Time:  Tuesday July 08 2021 04:44:20 EST Ventricular Rate:  45 PR Interval:  133 QRS Duration: 100 QT Interval:  454 QTC Calculation: 393 R Axis:   62 Text Interpretation: Sinus bradycardia Otherwise within normal limits When  compared with ECG of 11/02/2019, No significant change was found Confirmed by Dione BoozeGlick, Madysyn Hanken ([id number]) on 07/08/2021 4:52:04 AM  Procedures .Epistaxis Management  Date/Time: 07/08/2021 2:00 AM Performed by: Dione BoozeGlick, Sammuel Blick, MD Authorized by: Dione BoozeGlick, Divonte Senger, MD   Consent:    Consent obtained:  Verbal   Consent given by:  Patient   Risks, benefits, and alternatives were discussed: yes     Risks discussed:  Bleeding, nasal injury and pain   Alternatives discussed:  Alternative treatment Universal protocol:    Procedure explained and questions answered to patient or proxy's satisfaction: yes     Relevant documents present and verified: yes     Required blood products, implants, devices, and special equipment available: yes     Site/side marked: yes     Immediately prior to procedure, a time out was called: yes     Patient identity confirmed:  Verbally with patient and arm band Anesthesia:    Anesthesia method:  None Procedure details:    Treatment site:  R anterior   Repair method: Rapi Rhino 7.5 cma.   Treatment complexity:  Limited   Treatment episode: initial   Post-procedure details:    Assessment:  Bleeding stopped   Procedure completion:  Tolerated (Treatment was complicated by pain, managed with oxycodone-acetaminophen.) Comments:     Initially, attempted to control bleeding with topical and nebulized tranexamic acid, but this treatment was unsuccessful.  Following this, Rapid Rhino 7.5 cm was inserted with control of bleeding.   Cardiac monitor shows sinus bradycardia per my interpretation.  Medications Ordered in ED Medications  tranexamic acid (CYKLOKAPRON) injection 500 mg (500 mg Topical Given by Other 07/08/21 0227)  oxyCODONE-acetaminophen (PERCOCET/ROXICET) 5-325 MG per tablet 1 tablet (1 tablet Oral Given 07/08/21 0358)  lactated ringers bolus 1,000 mL (0 mLs Intravenous Stopped 07/08/21 0547)    ED Course/ Medical Decision Making/ A&P                           Medical  Decision Making  Nosebleed in a patient who is on dual antiplatelet therapy.  We will try topical and nebulized tranexamic acid.  Old records are reviewed, and he has no relevant past visits.  After topical tranexamic acid was applied, bleeding seems to have stopped, but started up again during nebulized tranexamic acid treatment.Rapid Rhino 7.5 cm was inserted.  Insertion was complicated by poor pain and he was given a dose of oxycodone-acetaminophen.  There initially was some slight seeping of blood around the Rapid Rhino, but then bleeding stopped and continued to be controlled.  However, patient was noted to get very pale and heart rate dropped to 38 and he became slightly less responsive.  This was felt to be most likely a vasovagal episode, but labs were obtained and he was given IV fluids.  Hemoglobin was noted to be unchanged from baseline, electrolytes were normal.  ECG showed sinus bradycardia, but it is noted that all prior ECGs had sinus bradycardia.  Heart rate did improve to 50 and patient was feeling much better at this point.  He was felt to be safe for discharge with close follow-up with ENT.  He is discharged with prescription for amoxicillin and oxycodone-acetaminophen.  Strict return precautions are given.        Final Clinical Impression(s) / ED Diagnoses Final diagnoses:  Right-sided epistaxis    Rx / DC Orders ED Discharge Orders          Ordered    oxyCODONE-acetaminophen (PERCOCET/ROXICET) 5-325 MG tablet  Every 6 hours PRN        07/08/21 0609    amoxicillin (AMOXIL) 500 MG capsule  2 times daily        07/08/21 2035              Dione Booze, MD 07/08/21 [phone]

## 2021-07-28 IMAGING — CT CT ANGIO NECK
1 of 8 series · 14 of 46 positions shown, 19 images · IV contrast (OMNI)
Comparison: None.

CLINICAL DATA: Stroke follow-up

EXAM:
CT ANGIOGRAPHY HEAD AND NECK
TECHNIQUE: Multidetector CT imaging of the head and neck was performed using
the standard protocol during bolus administration of intravenous
contrast. Multiplanar CT image reconstructions and MIPs were
obtained to evaluate the vascular anatomy. Carotid stenosis
measurements (when applicable) are obtained utilizing NASCET
criteria, using the distal internal carotid diameter as the
denominator.
CONTRAST:  75mL OMNIPAQUE IOHEXOL 350 MG/ML SOLN

[Series 12: thin · axial · 0.53mm/px · z∈[-395,-58]mm · 14 of 773 slices shown, 19 images]
[im 49/773  soft-tissue]
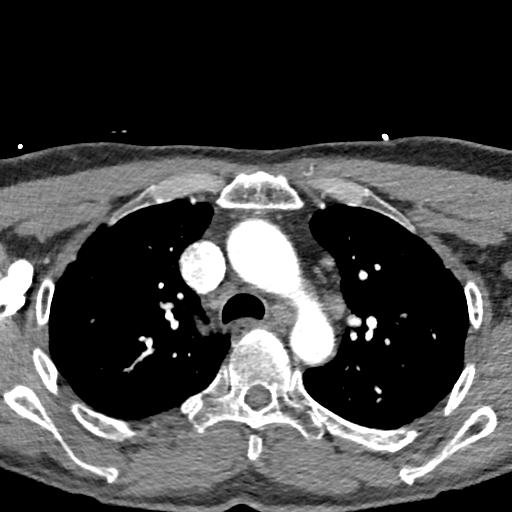
[im 49/773  bone]
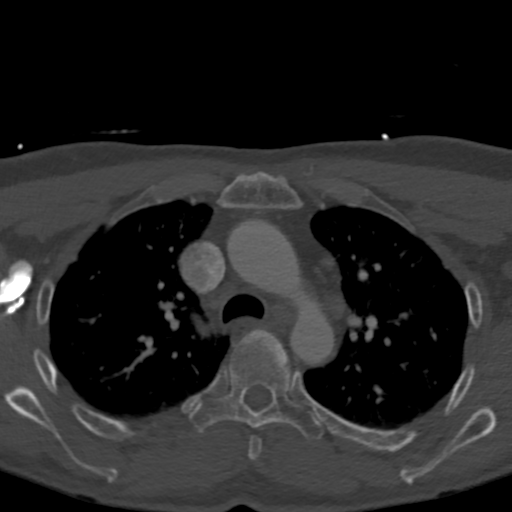
[im 97/773  soft-tissue]
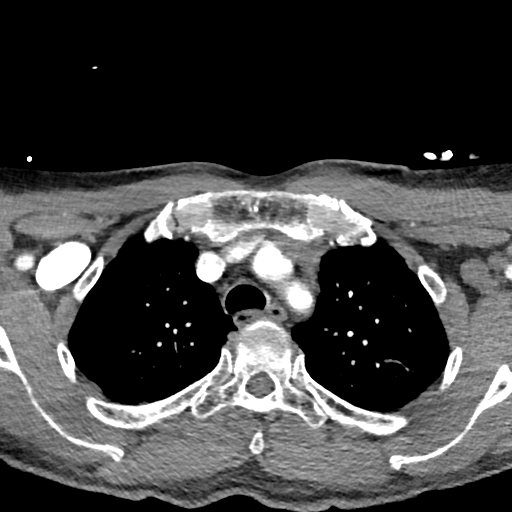
[im 145/773  soft-tissue]
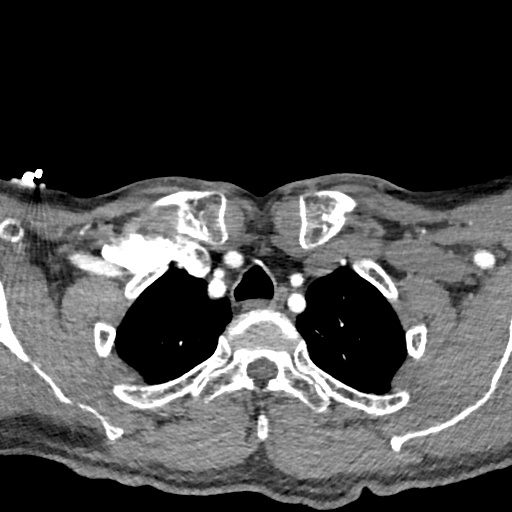
[im 242/773  soft-tissue]
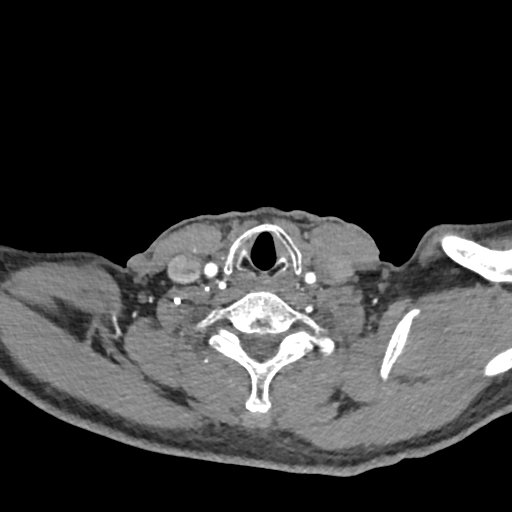
[im 290/773  soft-tissue]
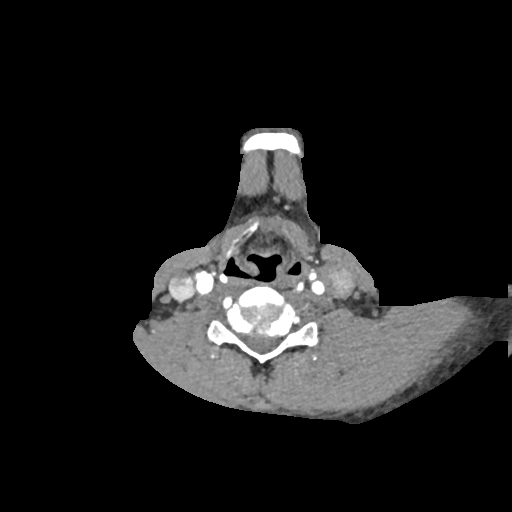
[im 338/773  soft-tissue]
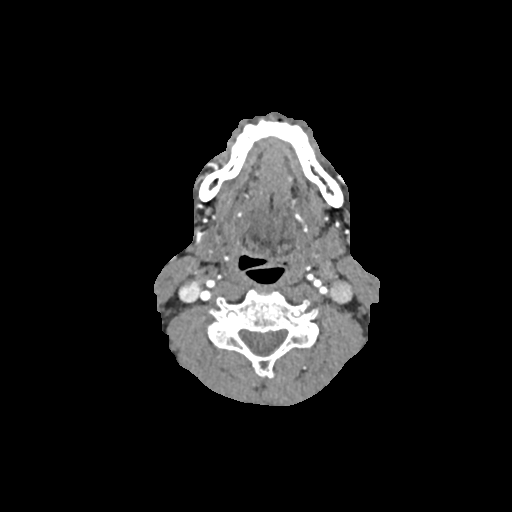
[im 387/773  soft-tissue]
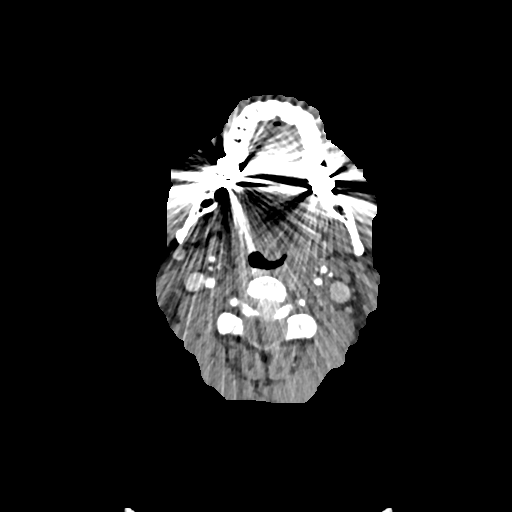
[im 435/773  soft-tissue]
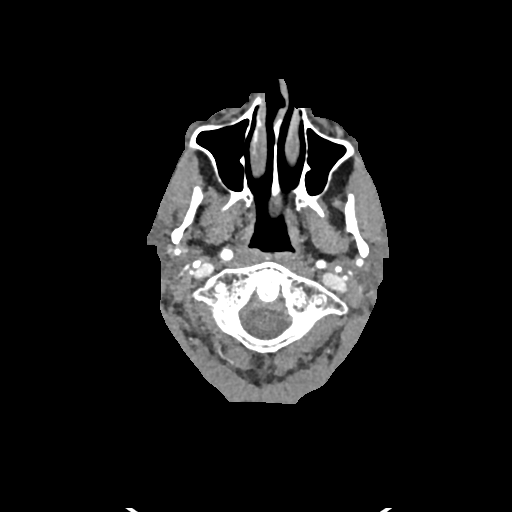
[im 483/773  soft-tissue]
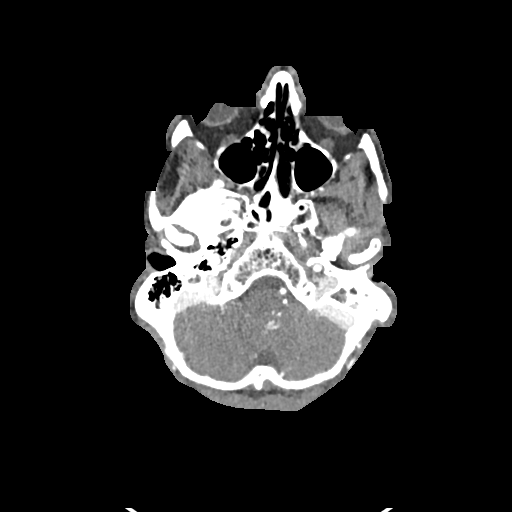
[im 483/773  bone]
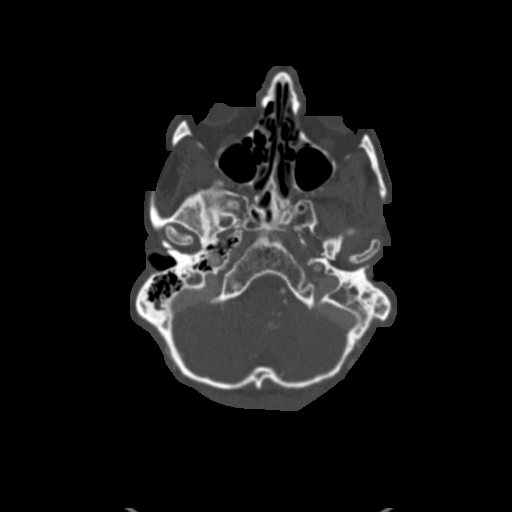
[im 531/773  soft-tissue]
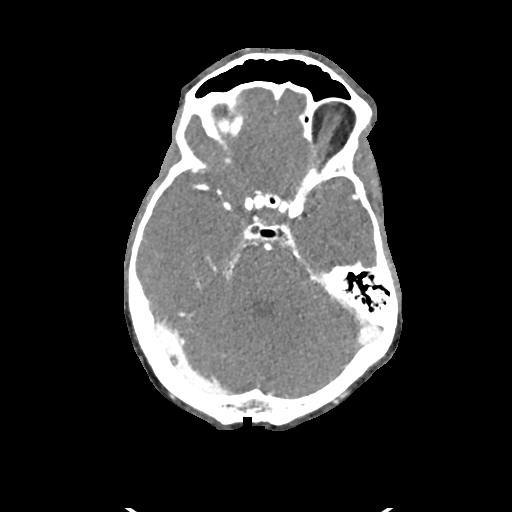
[im 580/773  lung]
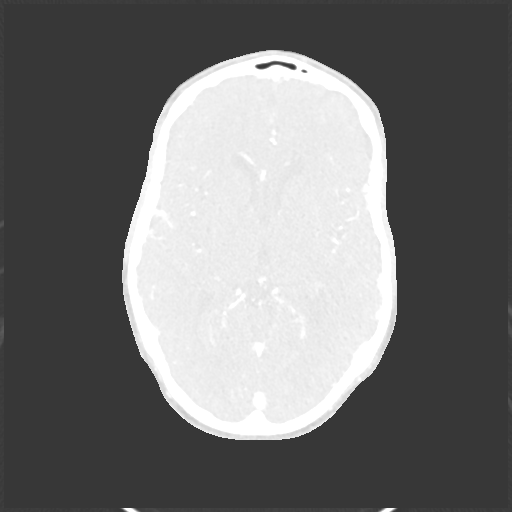
[im 628/773  soft-tissue]
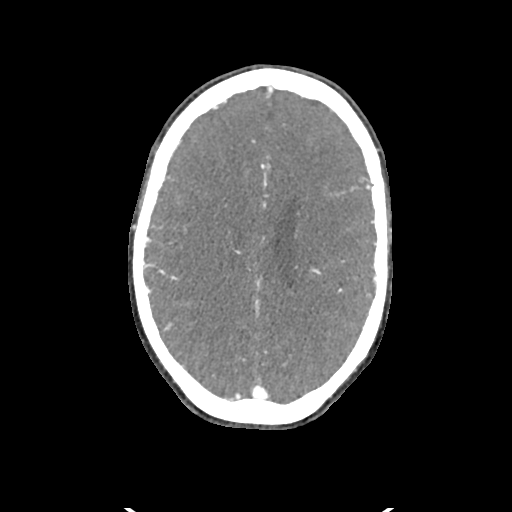
[im 628/773  lung]
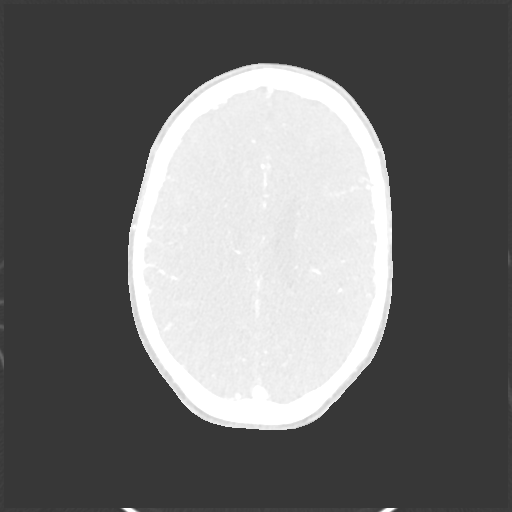
[im 676/773  soft-tissue]
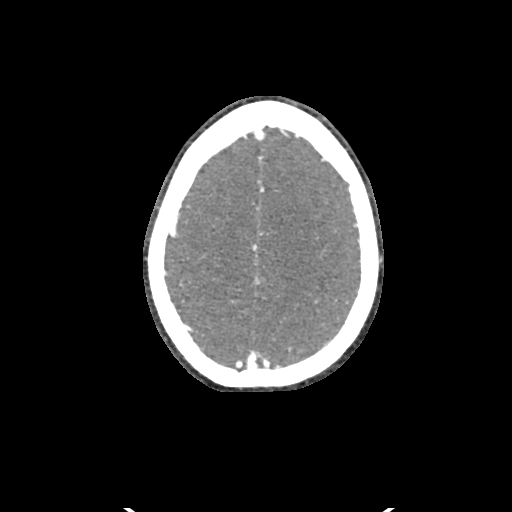
[im 676/773  lung]
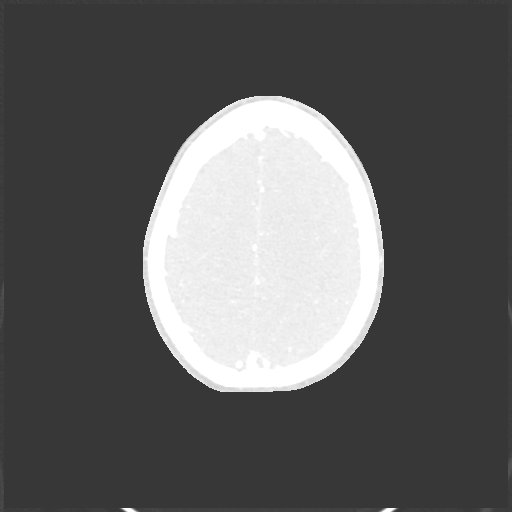
[im 724/773  soft-tissue]
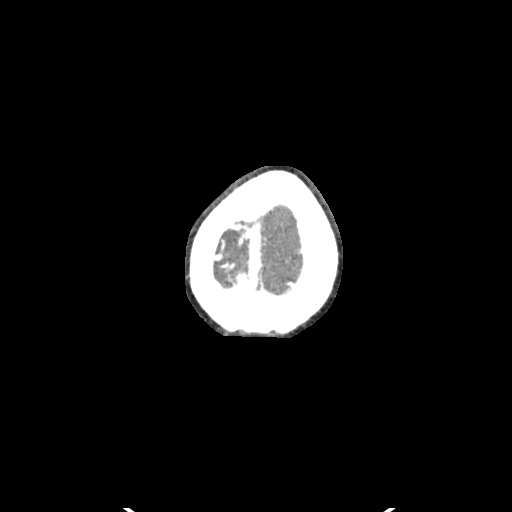
[im 724/773  lung]
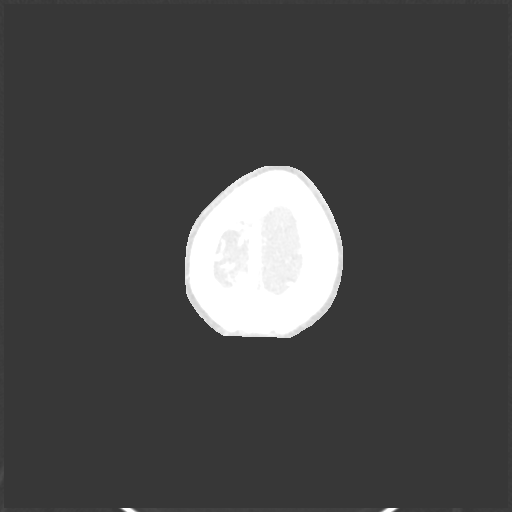

[14 of 46 positions shown; findings below may reference images not displayed]

FINDINGS: CTA NECK

Aortic arch: Great vessel origins are patent.

Right carotid system: Patent. Mild calcified plaque at the ICA
origin causing minimal stenosis.

Left carotid system: Patent. Calcified and noncalcified plaque at
the ICA origin causing mild, less than 50% stenosis.

Vertebral arteries: Patent.  Right vertebral artery is dominant.

Skeleton: Degenerative changes of the cervical spine

Other neck: No mass or adenopathy

Upper chest: No apical lung mass

Review of the MIP images confirms the above findings

CTA HEAD

Anterior circulation: Intracranial internal carotid arteries are
patent with calcified plaque along the cavernous and paraclinoid
portions causing mild to moderate stenosis on the left. There is
calcified and noncalcified plaque along the left communicating
segment with moderate to marked stenosis. Anterior and middle
cerebral arteries are patent.

Posterior circulation: Intracranial vertebral arteries, basilar
artery, and posterior cerebral arteries are patent.

Venous sinuses: Patent as allowed by contrast bolus timing.

Review of the MIP images confirms the above findings
IMPRESSION: Atherosclerotic plaque without hemodynamically significant stenosis
in the neck.

Moderate to marked stenosis of the distal supraclinoid left ICA.

## 2021-07-28 IMAGING — MR MR HEAD W/O CM
6 of 11 series · 24 of 48 positions shown · non-contrast
Comparison: Noncontrast head CT 09/02/2019

CLINICAL DATA: Focal neuro deficit, greater than 6 hours, stroke
suspected. Additional history provided: 63-year-old male, last known
normal 9 a.m. sudden onset left arm tingling and left face numbness,
left facial droop.

EXAM:
MRI HEAD WITHOUT CONTRAST
TECHNIQUE: Multiplanar, multiecho pulse sequences of the brain and surrounding
structures were obtained without intravenous contrast.

[Series 2: DWI · axial · 3.0mm · 0.94mm/px · z∈[-86,+57]mm · 7 of 102 slices shown (1 of 2)]
[im 1/102]
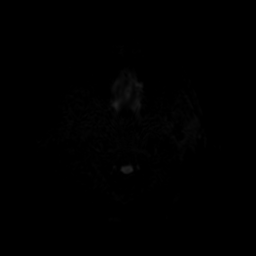
[im 17/102]
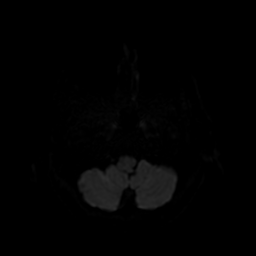
[im 34/102]
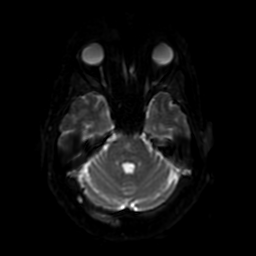
[im 51/102]
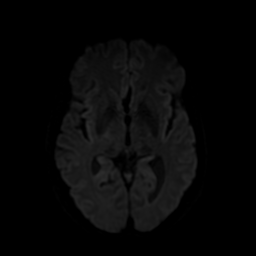
[im 68/102]
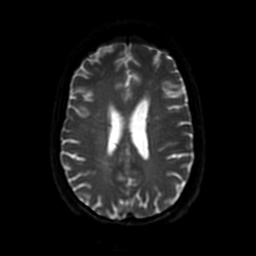
[im 85/102]
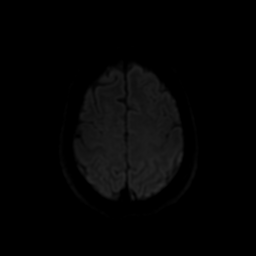
[im 102/102]
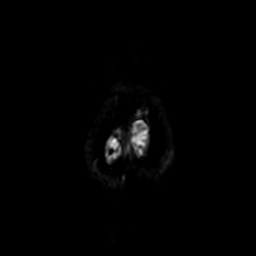

[Series 3: DWI · coronal · 4.0mm · 0.94mm/px · 6 of 76 slices shown (2 of 2)]
[im 1/76]
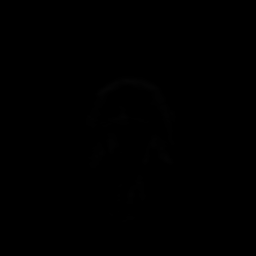
[im 16/76]
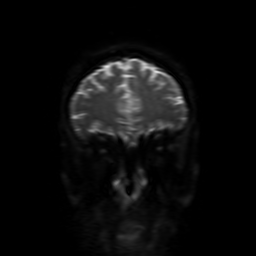
[im 31/76]
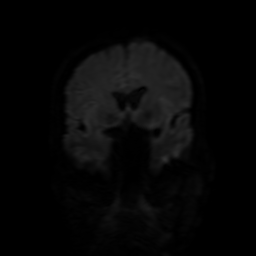
[im 46/76]
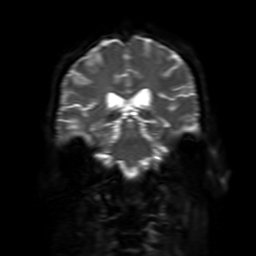
[im 61/76]
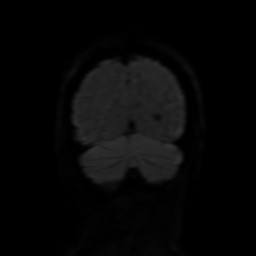
[im 76/76]
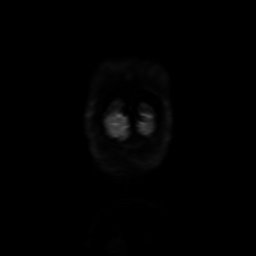

[Series 4: FLAIR · sagittal · 5.0mm · 0.23mm/px · 2 of 24 slices shown (1 of 2)]
[im 1/24]
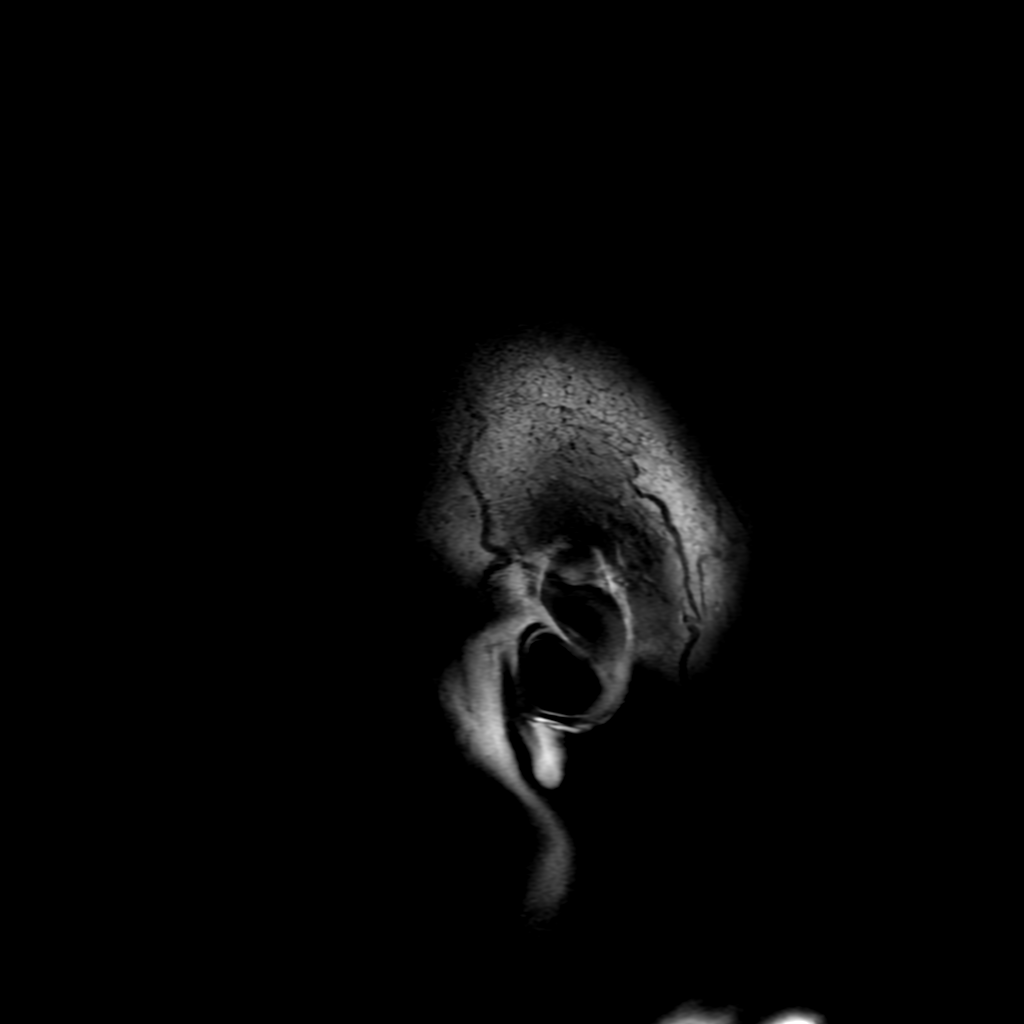
[im 24/24]
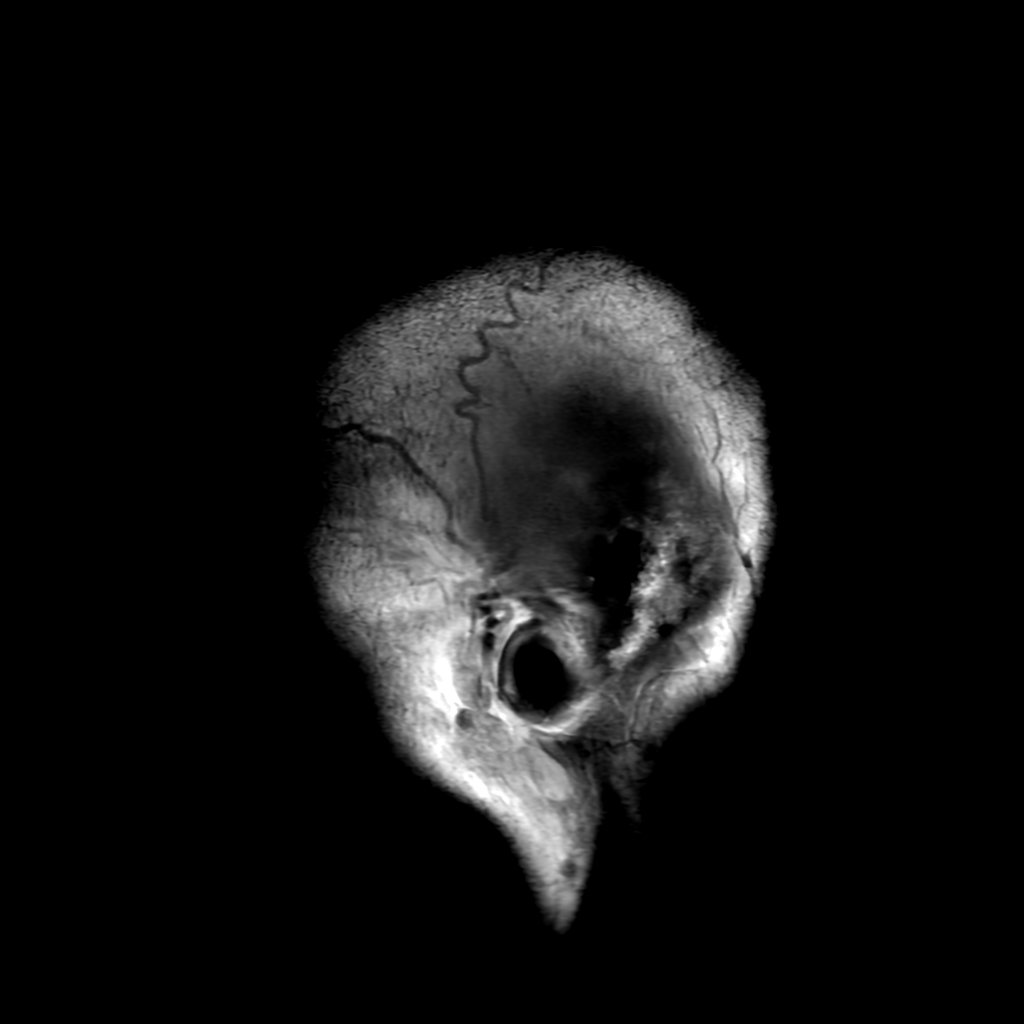

[Series 6: FLAIR · axial · 3.0mm · 0.47mm/px · z∈[-83,+60]mm · 2 of 26 slices shown (2 of 2)]
[im 1/26]
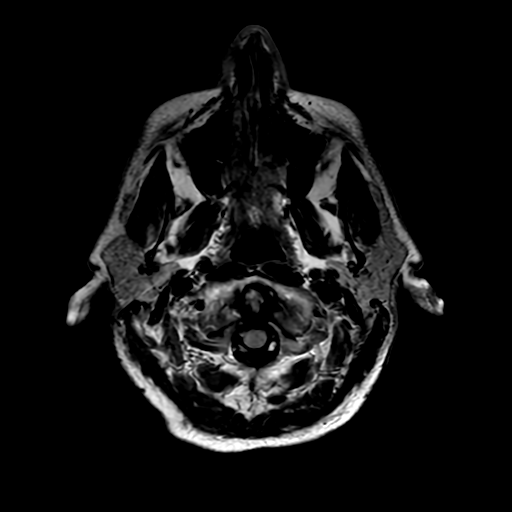
[im 26/26]
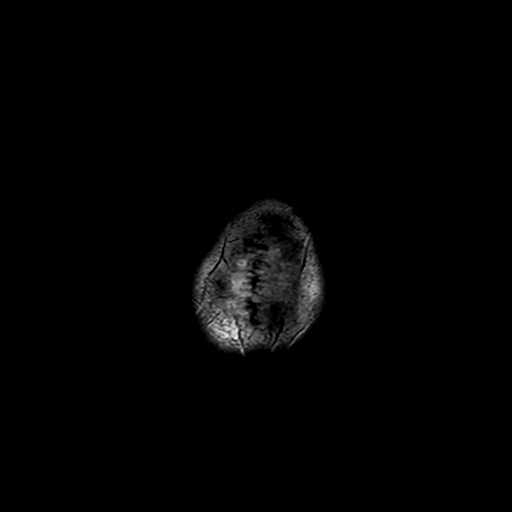

[Series 250: ADC · axial · 3.0mm · 0.94mm/px · z∈[-86,+57]mm · 4 of 48 slices shown (1 of 2)]
[im 1/48]
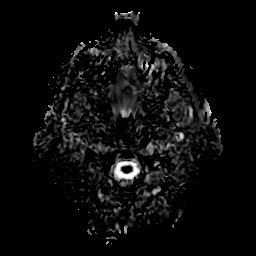
[im 16/48]
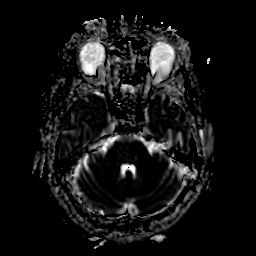
[im 32/48]
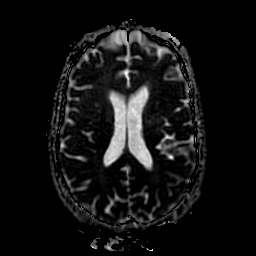
[im 48/48]
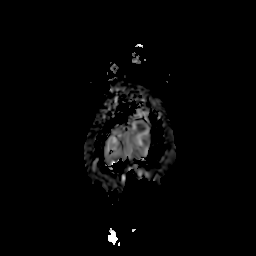

[Series 350: ADC · coronal · 4.0mm · 0.94mm/px · 3 of 38 slices shown (2 of 2)]
[im 1/38]
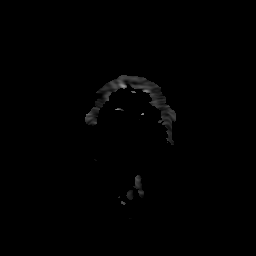
[im 19/38]
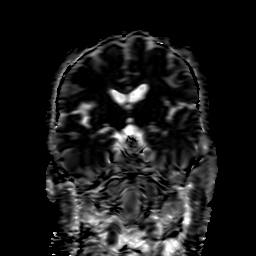
[im 38/38]
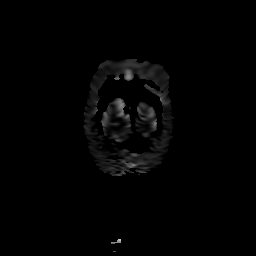

[24 of 48 positions shown; findings below may reference images not displayed]

FINDINGS: Brain:

There is vague restricted diffusion within the posterior right
frontal lobe centrum semiovale/corona radiata, tail of the right
caudate nucleus and extending inferiorly within the right internal
capsule. Findings are consistent with acute infarction given the
provided history.

Background mild scattered T2/FLAIR hyperintensity within the
cerebral white matter and pons is nonspecific, but consistent with
chronic small vessel ischemic disease.

There is a small focus of SWI signal loss within the anterior left
frontal lobe consistent with chronic blood products (series 7, image
54).

Cerebral volume is normal.

No evidence of intracranial mass.

No extra-axial fluid collection.

No midline shift.

Vascular: Expected proximal arterial flow voids.

Skull and upper cervical spine: No focal marrow lesion.

Sinuses/Orbits: Bilateral lens replacements. Mild ethmoid and
maxillary sinus mucosal thickening. Bilateral mastoid effusions.
IMPRESSION: 1. Vague restricted diffusion within the posterior right frontal
lobe white matter, tail of right caudate nucleus and extending
inferiorly within the right internal capsule. Findings are
consistent with acute infarction.
2. Mild chronic small vessel ischemic changes within the cerebral
white matter and pons.
3. Small nonspecific chronic hemorrhage within the anterior left
frontal lobe.
4. Mild ethmoid and maxillary sinus mucosal thickening.
5. Bilateral mastoid effusions.

## 2021-11-13 ENCOUNTER — Other Ambulatory Visit: Payer: Self-pay | Admitting: Cardiovascular Disease

## 2022-01-22 ENCOUNTER — Other Ambulatory Visit: Payer: Self-pay | Admitting: Cardiovascular Disease

## 2022-02-26 ENCOUNTER — Other Ambulatory Visit: Payer: Self-pay | Admitting: Cardiovascular Disease

## 2022-04-24 ENCOUNTER — Other Ambulatory Visit: Payer: Self-pay | Admitting: Cardiovascular Disease

## 2022-05-12 ENCOUNTER — Ambulatory Visit: Payer: Medicare Other | Admitting: Cardiovascular Disease

## 2022-05-29 ENCOUNTER — Other Ambulatory Visit: Payer: Self-pay | Admitting: Cardiovascular Disease

## 2022-05-29 ENCOUNTER — Ambulatory Visit: Payer: Medicare Other | Admitting: Nurse Practitioner

## 2022-07-06 NOTE — Progress Notes (Unsigned)
Cardiology Office Note:    Date:  07/06/2022   ID:  Kyle Hansen, DOB 02/26/1956, MRN [id number]  PCP:  Nickola Major, MD [place] Cardiologist: Quay Burow, MD    Reason for visit: 1 year follow-up  History of Present Illness:    Kyle Hansen is a 67 y.o. male with a hx of hyperlipidemia, CVA in 2021 (speech impediment, left hand tingling) - TEE without embolic source, monitor without A-fib.  Patient has a strong family history of heart disease with a father having his first MI at age 23, brother had first MI at age 69 and CABG at age 55.  He was last seen by Dr. Alvester Chou in November 2022.  Patient was going to the gym 3 days a week without chest pain shortness of breath.  He was treated with statin, aspirin and Plavix.  Today, ***  Hyperlipidemia -*** -Discussed cholesterol lowering diets - Mediterranean diet, DASH diet, vegetarian diet, low-carbohydrate diet and avoidance of trans fats.  Discussed healthier choice substitutes.  Nuts, high-fiber foods, and fiber supplements may also improve lipids.    Family history of heart disease -Cardiac risk factors ***  Disposition - Follow-up in ***       Past Medical History:  Diagnosis Date   Blood dyscrasia    Family history of premature CAD    Hyperlipidemia LDL goal < 70    Mild carotid artery disease ([place]) 11/2010   bilateral   Normal cardiac stress test 12/17/2010   though there was a marked hypertensive response    Porphyria ([place])    multiple drugs to avoidsome meds are safe    Past Surgical History:  Procedure Laterality Date   BUBBLE STUDY  11/20/2019   Procedure: BUBBLE STUDY;  Surgeon: Geralynn Rile, MD;  Location: [place];  Service: Cardiovascular;;   COLONOSCOPY     FOOT ARTHRODESIS  2009   lt   HERNIA REPAIR  1993   MASS EXCISION  06/16/2011   Procedure: EXCISION MASS;  Surgeon: Cammie Sickle., MD;  Location: [facility];  Service: Orthopedics;   Laterality: Left;  left elbow, and posterior interosseus nerve decompression with injection of right olecranon bursa   ORIF CALCANEOUS FRACTURE  2008   lt   PIP JOINT FUSION  2006   lt   SHOULDER ARTHROSCOPY  2008   lt   TEE WITHOUT CARDIOVERSION N/A 11/20/2019   Procedure: TRANSESOPHAGEAL ECHOCARDIOGRAM (TEE);  Surgeon: Geralynn Rile, MD;  Location: [place];  Service: Cardiovascular;  Laterality: N/A;    Current Medications: No outpatient medications have been marked as taking for the 07/08/22 encounter (Appointment) with Warren Lacy, PA-C.     Allergies:   Barbiturates, Chloramphenicols, Griseofulvin, Librium, Methsuximide, [place] [meprobamate], Nicergoline, Orinase [tolbutamide], Sulfonamide derivatives, Tofranil-pm, Atorvastatin, Chlordiazepoxide, Imipramine, Phenytoin, Phenytoin sodium extended, and Sulfa antibiotics   Social History   Socioeconomic History   Marital status: Married    Spouse name: Kyle Hansen   Number of children: 2   Years of education: Not on file   Highest education level: Not on file  Occupational History   Occupation: Retired  Tobacco Use   Smoking status: Never   Smokeless tobacco: Current  Substance and Sexual Activity   Alcohol use: No   Drug use: No   Sexual activity: Not on file  Other Topics Concern   Not on file  Social History Narrative   Not on file   Social Determinants of Health  Financial Resource Strain: Not on file  Food Insecurity: Not on file  Transportation Needs: Not on file  Physical Activity: Not on file  Stress: Not on file  Social Connections: Not on file     Family History: The patient's family history includes Heart attack in his brother and father; Heart disease in his brother and father; Hyperlipidemia in his brother and father.  ROS:   Please see the history of present illness.     EKGs/Labs/Other Studies Reviewed:    EKG:  The ekg ordered today demonstrates ***  Recent  Labs: 07/08/2021: BUN 24; Creatinine, Ser 0.93; Hemoglobin 13.7; Platelets 178; Potassium 4.2; Sodium 135   Recent Lipid Panel Lab Results  Component Value Date/Time   CHOL 164 05/07/2021 09:37 AM   TRIG 55 05/07/2021 09:37 AM   HDL 68 05/07/2021 09:37 AM   LDLCALC 85 05/07/2021 09:37 AM    Physical Exam:    VS:  There were no vitals taken for this visit.   No data found.  No BP recorded.  {Refresh Note OR Click here to enter BP  :1}***    Wt Readings from Last 3 Encounters:  07/07/21 163 lb (73.9 kg)  05/07/21 167 lb 3.2 oz (75.8 kg)  05/07/20 166 lb (75.3 kg)     GEN: *** Well nourished, well developed in no acute distress HEENT: Normal NECK: No JVD; No carotid bruits CARDIAC: ***RRR, no murmurs, rubs, gallops RESPIRATORY:  Clear to auscultation without rales, wheezing or rhonchi  ABDOMEN: Soft, non-tender, non-distended MUSCULOSKELETAL: No edema SKIN: Warm and dry NEUROLOGIC:  Alert and oriented PSYCHIATRIC:  Normal affect     ASSESSMENT AND PLAN   ***   {Are you ordering a CV Procedure (e.g. stress test, cath, DCCV, TEE, etc)?   Press F2        :[id number]}    Medication Adjustments/Labs and Tests Ordered: Current medicines are reviewed at length with the patient today.  Concerns regarding medicines are outlined above.  No orders of the defined types were placed in this encounter.  No orders of the defined types were placed in this encounter.   There are no Patient Instructions on file for this visit.   Signed, Cannon Kettle, PA-C  07/06/2022 3:46 PM     [facility]

## 2022-07-08 ENCOUNTER — Encounter: Payer: Self-pay | Admitting: Physician Assistant

## 2022-07-08 ENCOUNTER — Ambulatory Visit: Payer: Medicare Other | Attending: Cardiovascular Disease | Admitting: Physician Assistant

## 2022-07-08 VITALS — BP 136/80 | HR 47 | Ht 68.0 in | Wt 170.4 lb

## 2022-07-08 DIAGNOSIS — E785 Hyperlipidemia, unspecified: Secondary | ICD-10-CM

## 2022-07-08 DIAGNOSIS — I779 Disorder of arteries and arterioles, unspecified: Secondary | ICD-10-CM

## 2022-07-08 DIAGNOSIS — Z8249 Family history of ischemic heart disease and other diseases of the circulatory system: Secondary | ICD-10-CM

## 2022-07-08 LAB — LIPID PANEL
Chol/HDL Ratio: 2.8 ratio (ref 0.0–5.0)
Cholesterol, Total: 197 mg/dL (ref 100–199)
HDL: 70 mg/dL (ref >39)
LDL Chol Calc (NIH): 115 mg/dL — ABNORMAL HIGH (ref 0–99)
Triglycerides: 68 mg/dL (ref 0–149)
VLDL Cholesterol Cal: 12 mg/dL (ref 5–40)

## 2022-07-08 MED ORDER — CLOPIDOGREL BISULFATE 75 MG PO TABS: 75.0000 mg | ORAL_TABLET | Freq: Every day | ORAL | 2 refills | Status: DC

## 2022-07-08 MED ORDER — ROSUVASTATIN CALCIUM 20 MG PO TABS: 20.0000 mg | ORAL_TABLET | Freq: Every day | ORAL | 3 refills | Status: DC

## 2022-07-08 NOTE — Patient Instructions (Signed)
Medication Instructions:  No Changes *If you need a refill on your cardiac medications before your next appointment, please call your pharmacy*   Lab Work: Lipid Panel If you have labs (blood work) drawn today and your tests are completely normal, you will receive your results only by: [place] (if you have MyChart) OR A paper copy in the mail If you have any lab test that is abnormal or we need to change your treatment, we will call you to review the results.   Testing/Procedures: [place], [address]. Your physician has requested that you have a carotid duplex. This test is an ultrasound of the carotid arteries in your neck. It looks at blood flow through these arteries that supply the brain with blood. Allow one hour for this exam. There are no restrictions or special instructions.    Follow-Up: At [facility], you and your health needs are our priority.  As part of our continuing mission to provide you with exceptional heart care, we have created designated Provider Care Teams.  These Care Teams include your primary Cardiologist (physician) and Advanced Practice Providers (APPs -  Physician Assistants and Nurse Practitioners) who all work together to provide you with the care you need, when you need it.  We recommend signing up for the patient portal called "MyChart".  Sign up information is provided on this After Visit Summary.  MyChart is used to connect with patients for Virtual Visits (Telemedicine).  Patients are able to view lab/test results, encounter notes, upcoming appointments, etc.  Non-urgent messages can be sent to your provider as well.   To learn more about what you can do with MyChart, go to [email or web address].    Your next appointment:   1 year(s)  The format for your next appointment:   In Person  Provider:   Quay Burow, MD

## 2022-07-09 ENCOUNTER — Ambulatory Visit (HOSPITAL_COMMUNITY)
Admission: RE | Admit: 2022-07-09 | Discharge: 2022-07-09 | Disposition: A | Discharge status: Home / Self Care | Payer: Medicare Other | Source: Ambulatory Visit | Attending: Internal Medicine | Admitting: Internal Medicine

## 2022-07-09 DIAGNOSIS — Z8249 Family history of ischemic heart disease and other diseases of the circulatory system: Secondary | ICD-10-CM | POA: Insufficient documentation

## 2022-07-09 DIAGNOSIS — I779 Disorder of arteries and arterioles, unspecified: Secondary | ICD-10-CM | POA: Insufficient documentation

## 2022-07-09 DIAGNOSIS — E785 Hyperlipidemia, unspecified: Secondary | ICD-10-CM | POA: Insufficient documentation

## 2022-07-16 ENCOUNTER — Telehealth: Payer: Self-pay

## 2022-07-16 NOTE — Telephone Encounter (Addendum)
Called patient regarding results. Patient had understanding of results.----- Message from Warren Lacy, PA-C sent at 07/14/2022  1:34 PM EST ----- Mild carotid artery stenosis - stable.   Continue antiplatelet and statin therapy.

## 2022-07-22 ENCOUNTER — Telehealth: Payer: Self-pay

## 2022-07-22 DIAGNOSIS — E785 Hyperlipidemia, unspecified: Secondary | ICD-10-CM

## 2022-07-22 MED ORDER — EZETIMIBE 10 MG PO TABS: 10.0000 mg | ORAL_TABLET | Freq: Every day | ORAL | 2 refills | Status: DC

## 2022-07-22 NOTE — Telephone Encounter (Addendum)
Called patient regarding concern about increasing Crestor . Per Melvenia Needles patient will start Zetia  10 mg Daily. Patient understood provider recommendations and is also aware will return in 3 months for repeat labs.----- Message from Warren Lacy, PA-C sent at 07/22/2022  9:02 AM EST ----- Regarding: Lipids Bartley Vuolo,   Now, LDL is higher at 115.  Patient informs [place] he cannot tolerate Crestor 40 mg daily.  Recommend: -Continue Crestor 20 mg daily. -Start Zetia 10 mg daily. -Mediterranean diet, DASH diet, vegetarian diet, low-carbohydrate diet and avoidance of trans fats.  Discussed healthier choice substitutes.  Nuts, high-fiber foods, and fiber supplements may also improve lipids.   -Recheck cholesterol in 3 months.     [place] - please send RX for Zetia 10 mg daily and have patient recheck lipids in 3 months. Thanks! Danise Mina  ----- Message ----- From: Monia Pouch, CMA Sent: 07/16/2022   1:35 PM EST To: Warren Lacy, PA-C  Claris Gladden ;  I spoke with Mr. Mclaine in regards to increasing Crestor to 40 mg Daily. He stated he can not due causing joint pain.  Patient stated if there is something else that he could take because he cannot tolerate a higher dose.  Thanks

## 2022-10-23 ENCOUNTER — Ambulatory Visit
Admission: RE | Admit: 2022-10-23 | Discharge: 2022-10-23 | Disposition: A | Discharge status: Home / Self Care | Payer: Medicare Other | Source: Ambulatory Visit | Attending: Gastroenterology | Admitting: Gastroenterology

## 2022-10-23 ENCOUNTER — Other Ambulatory Visit: Payer: Self-pay | Admitting: Gastroenterology

## 2022-10-23 DIAGNOSIS — R1032 Left lower quadrant pain: Secondary | ICD-10-CM

## 2022-11-11 ENCOUNTER — Other Ambulatory Visit: Payer: Self-pay | Admitting: Gastroenterology

## 2022-11-11 DIAGNOSIS — R194 Change in bowel habit: Secondary | ICD-10-CM

## 2022-11-11 DIAGNOSIS — R109 Unspecified abdominal pain: Secondary | ICD-10-CM

## 2022-11-11 LAB — LIPID PANEL
Chol/HDL Ratio: 2.2 ratio (ref 0.0–5.0)
Cholesterol, Total: 143 mg/dL (ref 100–199)
HDL: 66 mg/dL (ref >39)
LDL Chol Calc (NIH): 64 mg/dL (ref 0–99)
Triglycerides: 61 mg/dL (ref 0–149)
VLDL Cholesterol Cal: 13 mg/dL (ref 5–40)

## 2022-11-17 ENCOUNTER — Telehealth: Payer: Self-pay

## 2022-11-17 NOTE — Telephone Encounter (Addendum)
Called patient regarding results. Patient had understanding of results.----- Message from Cannon Kettle, PA-C sent at 11/17/2022  1:20 PM EDT ----- LDL at goal!  LDL (bad cholesterol) is 64.  Improved from check in January 2024 (115 at that time).  Goal LDL less than 70. All other numbers at goal as well.  -Continue Crestor 20 mg daily. -Cholesterol lowering diets - Mediterranean diet, DASH diet, vegetarian diet, low-carbohydrate diet and avoidance of trans fats.  Discussed healthier choice substitutes.  Nuts, high-fiber foods, and fiber supplements may also improve lipids.

## 2022-11-30 ENCOUNTER — Ambulatory Visit
Admission: RE | Admit: 2022-11-30 | Discharge: 2022-11-30 | Disposition: A | Discharge status: Home / Self Care | Payer: Medicare Other | Source: Ambulatory Visit | Attending: Gastroenterology | Admitting: Gastroenterology

## 2022-11-30 DIAGNOSIS — R109 Unspecified abdominal pain: Secondary | ICD-10-CM

## 2022-11-30 DIAGNOSIS — R194 Change in bowel habit: Secondary | ICD-10-CM

## 2022-11-30 MED ORDER — IOPAMIDOL (ISOVUE-300) INJECTION 61%
100.0000 mL | Freq: Once | INTRAVENOUS | Status: AC | PRN
  Administered 2022-11-30: 100 mL via INTRAVENOUS

## 2022-12-08 ENCOUNTER — Ambulatory Visit
Admission: RE | Admit: 2022-12-08 | Discharge: 2022-12-08 | Disposition: A | Discharge status: Home / Self Care | Payer: Medicare Other | Source: Ambulatory Visit | Attending: Gastroenterology | Admitting: Gastroenterology

## 2022-12-08 ENCOUNTER — Other Ambulatory Visit: Payer: Medicare Other

## 2022-12-08 ENCOUNTER — Other Ambulatory Visit: Payer: Self-pay | Admitting: Gastroenterology

## 2022-12-08 DIAGNOSIS — R109 Unspecified abdominal pain: Secondary | ICD-10-CM

## 2022-12-08 DIAGNOSIS — R194 Change in bowel habit: Secondary | ICD-10-CM

## 2023-02-04 ENCOUNTER — Telehealth: Payer: Self-pay | Admitting: Cardiovascular Disease

## 2023-02-04 NOTE — Telephone Encounter (Signed)
Called to patient, and to VM.  LM to call office

## 2023-02-04 NOTE — Telephone Encounter (Signed)
Patient states he is bruised all the way down his left leg. It started with a knot on last Thursday, 8/01, and it spread to the rest of his leg. Patient states his leg is sore to touch.

## 2023-02-04 NOTE — Telephone Encounter (Signed)
Left voicemail to return call to office.

## 2023-02-04 NOTE — Progress Notes (Addendum)
Cardiology Clinic Note   Date: 02/05/2023 ID: Kyle Hansen, DOB 1955-11-13, MRN [id number]  Primary Cardiologist:  Nanetta Batty, MD  Patient Profile    Kyle Hansen is a 67 y.o. male who presents to the clinic today for evaluation of left lower extremity bruising and swelling.     Past medical history significant for: Carotid artery disease. Carotid duplex 07/09/2022: Bilateral ICA 1 to 39%.  Bilateral ECA > 50%.  Unchanged from prior study November 2022. Hyperlipidemia. CVA. May 2021. Family history of early CAD. Left lower extremity bruising/edema.  Vascular US left lower extremity performed at [facility] 8/5/024: No evidence of DVT.      History of Present Illness    Kyle Hansen is a longtime patient of Dr. Gery Pray followed for the above outlined history.  Patient was last seen in the office by Juanda Crumble, PA-C on 07/08/2022 for routine follow-up.  Doing well at that time and no medication changes were made.  Report recurrent nosebleeds for which he manages with saline nasal spray particularly in the winter.  Patient contacted the office via MyChart on 02/04/2023 with report of left lower extremity bruising and swelling. Per triage LPN: "Call to patient (My chart message regarding call as well). Patient states bad bruising to leg since last Thursday.  He states he spoke to a doctor at the walk in clinic at [place]. Radiology report in chart. No injury but visible bruising with a knot.  He states slight swelling at that time inside ankle size of tennis ball.  Swelling now from ankle to heel to outside of foot.  Ambulatory and moveable but has pressure where swelling is present. Pain 4/5. Elevates often. Used Ice a few times. Normal temperature. Non pitting. Patient appt set for tomorrow to be assessed   Today, patient is accompanied by his wife. He reports about 1 week ago he mowed a couple of lawns during the day. Later in the evening he  noticed pain in his left foot. He noted a small knot on his medial left ankle with a small reddened area that was very painful. He did not see any puncture marks or evidence of an insect bite. He did not injure his ankle while mowing lawns earlier in the day. The next morning he noted increased swelling and bruising extending to his foot. He was evaluated at an urgent care and underwent venous US that was negative for DVT. He reports continued swelling and bruising of medial ankle and foot that is now extending around his heel to his lateral ankle. The knot resolved but the area is still tender. He has been elevating it. Of note patient has a history of "shattering" his left heel and requiring ORIF in 2008.  He takes Plavix and aspirin for history of CVA.     ROS: All other systems reviewed and are otherwise negative except as noted in History of Present Illness.  Studies Reviewed    EKG Interpretation Date/Time:  Friday February 05 2023 09:12:59 EDT Ventricular Rate:  57 PR Interval:  148 QRS Duration:  96 QT Interval:  394 QTC Calculation: 383 R Axis:   61  Text Interpretation: Sinus bradycardia with marked sinus arrhythmia When compared with ECG of 08-Jul-2021 04:44, PREVIOUS ECG IS PRESENT Confirmed by Carlos Levering [id number]) on 02/05/2023 9:21:09 AM           Physical Exam    VS:  BP 120/66   Pulse (!) 57   Ht  5\' 8"  (1.727 m)   Wt 167 lb (75.8 kg)   BMI 25.39 kg/m  , BMI Body mass index is 25.39 kg/m.  GEN: Well nourished, well developed, in no acute distress. Neck: No JVD or carotid bruits. Cardiac:  RRR. No murmurs. No rubs or gallops.   Respiratory:  Respirations regular and unlabored. Clear to auscultation without rales, wheezing or rhonchi. GI: Soft, nontender, nondistended. Extremities: Radials/DP/PT 2+ and equal bilaterally. No clubbing or cyanosis. Left foot and ankle with moderate ecchymosis and mild nonpitting edema. No focal erythema or evidence of insect bite.  There is mild discoloration without edema extending 1/3 of the way up pretibial area. Skin: Warm and dry, no rash. Neuro: Strength intact.  Assessment & Plan    Left lower extremity bruising/swelling. Nontraumatic onset 01/28/2023. Started as a painful knot on medial left ankle after mowing a couple of lawns earlier in the day with no puncture marks or evidence of an insect bite. The next morning he noted swelling and bruising extending to medial foot that is now extending around his heel to lateral ankle.  Negative for DVT per vascular US performed at [facility] ordered by Urgent Care. Patient with good PT/DP pulses. Left foot and ankle with moderate ecchymosis and mild nonpitting edema. No focal erythema or evidence of insect bite on my inspection. There is mild discoloration without edema 1/3 of the way up up pretibial area. Discussed with Dr. Flora Lipps who agrees patient should stop Plavix and continue aspirin. Will get venous reflux study.   Disposition: Venous reflux study. Stop Plavix and continue aspirin. Will have patient keep recall with Dr. Allyson Sabal for January 2025. Will bring him in sooner if needed.          Signed, Etta Grandchild. Roann Merk, DNP, NP-C

## 2023-02-04 NOTE — Telephone Encounter (Signed)
Call to patient (My chart message regarding call as well). Patient states bad bruising to leg since last Thursday.  He states he spoke to a doctor at the walk in clinic at [place]. Radiology report in chart. No injury but visible bruising with a knot.  He states slight swelling at that time inside ankle size of tennis ball.  Swelling now from ankle to heel to outside of foot.  Ambulatory and moveable but has pressure where swelling is present. Pain 4/5.  Elevates often. Used Ice a few times. Normal temperature. Non pitting.  Patient appt set for tomorrow to be assessed

## 2023-02-04 NOTE — Telephone Encounter (Signed)
Pt was returning nurse call and is requesting a callback. Please advise. 

## 2023-02-05 ENCOUNTER — Ambulatory Visit: Payer: Medicare Other | Attending: Student | Admitting: Student

## 2023-02-05 ENCOUNTER — Encounter: Payer: Self-pay | Admitting: Student

## 2023-02-05 VITALS — BP 120/66 | HR 57 | Ht 68.0 in | Wt 167.0 lb

## 2023-02-05 DIAGNOSIS — M79605 Pain in left leg: Secondary | ICD-10-CM

## 2023-02-05 DIAGNOSIS — R609 Edema, unspecified: Secondary | ICD-10-CM | POA: Diagnosis not present

## 2023-02-05 DIAGNOSIS — T148XXD Other injury of unspecified body region, subsequent encounter: Secondary | ICD-10-CM

## 2023-02-05 DIAGNOSIS — E785 Hyperlipidemia, unspecified: Secondary | ICD-10-CM | POA: Diagnosis not present

## 2023-02-05 DIAGNOSIS — R001 Bradycardia, unspecified: Secondary | ICD-10-CM

## 2023-02-05 DIAGNOSIS — T148XXA Other injury of unspecified body region, initial encounter: Secondary | ICD-10-CM

## 2023-02-05 MED ORDER — EZETIMIBE 10 MG PO TABS
10.0000 mg | ORAL_TABLET | Freq: Every day | ORAL | 2 refills | Status: DC
Start: 2023-02-05 — End: 2023-09-21

## 2023-02-05 NOTE — Patient Instructions (Addendum)
Medication Instructions:  STOP PLAVIX 75MG  *If you need a refill on your cardiac medications before your next appointment, please call your pharmacy*   Lab Work: NO LABS If you have labs (blood work) drawn today and your tests are completely normal, you will receive your results only by: MyChart Message (if you have MyChart) OR A paper copy in the mail If you have any lab test that is abnormal or we need to change your treatment, we will call you to review the results.   Testing/Procedures:  Your physician has requested that you have a lower or upper extremity venous duplex. This test is an ultrasound of the veins in the legs or arms. It looks at venous blood flow that carries blood from the heart to the legs or arms. Allow one hour for a Lower Venous exam. Allow thirty minutes for an Upper Venous exam. There are no restrictions or special instructions.     Follow-Up: At [facility], you and your health needs are our priority.  As part of our continuing mission to provide you with exceptional heart care, we have created designated Provider Care Teams.  These Care Teams include your primary Cardiologist (physician) and Advanced Practice Providers (APPs -  Physician Assistants and Nurse Practitioners) who all work together to provide you with the care you need, when you need it.  We recommend signing up for the patient portal called "MyChart".  Sign up information is provided on this After Visit Summary.  MyChart is used to connect with patients for Virtual Visits (Telemedicine).  Patients are able to view lab/test results, encounter notes, upcoming appointments, etc.  Non-urgent messages can be sent to your provider as well.   To learn more about what you can do with MyChart, go to [email or web address].      **You will receive a letter in the mail as a reminder to call the office for you next office visit.**

## 2023-02-17 ENCOUNTER — Ambulatory Visit (HOSPITAL_COMMUNITY)
Admission: RE | Admit: 2023-02-17 | Discharge: 2023-02-17 | Disposition: A | Discharge status: Home / Self Care | Payer: Medicare Other | Source: Ambulatory Visit | Attending: Cardiovascular Disease | Admitting: Cardiovascular Disease

## 2023-02-17 DIAGNOSIS — R609 Edema, unspecified: Secondary | ICD-10-CM | POA: Diagnosis present

## 2023-02-17 DIAGNOSIS — R600 Localized edema: Secondary | ICD-10-CM | POA: Diagnosis present

## 2023-02-17 DIAGNOSIS — R601 Generalized edema: Secondary | ICD-10-CM | POA: Diagnosis not present

## 2023-02-17 DIAGNOSIS — S99912A Unspecified injury of left ankle, initial encounter: Secondary | ICD-10-CM | POA: Insufficient documentation

## 2023-02-17 DIAGNOSIS — T148XXA Other injury of unspecified body region, initial encounter: Secondary | ICD-10-CM | POA: Insufficient documentation

## 2023-03-15 ENCOUNTER — Other Ambulatory Visit: Payer: Self-pay | Admitting: Physician Assistant

## 2023-07-12 ENCOUNTER — Encounter: Payer: Self-pay | Admitting: Cardiovascular Disease

## 2023-07-12 ENCOUNTER — Ambulatory Visit: Payer: Medicare Other | Attending: Cardiovascular Disease | Admitting: Cardiovascular Disease

## 2023-07-12 VITALS — BP 124/80 | HR 48 | Ht 68.0 in | Wt 175.0 lb

## 2023-07-12 DIAGNOSIS — Z8249 Family history of ischemic heart disease and other diseases of the circulatory system: Secondary | ICD-10-CM | POA: Diagnosis not present

## 2023-07-12 DIAGNOSIS — E785 Hyperlipidemia, unspecified: Secondary | ICD-10-CM | POA: Diagnosis not present

## 2023-07-12 LAB — LIPID PANEL
Chol/HDL Ratio: 2.1 {ratio} (ref 0.0–5.0)
Cholesterol, Total: 156 mg/dL (ref 100–199)
HDL: 73 mg/dL (ref >39)
LDL Chol Calc (NIH): 72 mg/dL (ref 0–99)
Triglycerides: 54 mg/dL (ref 0–149)
VLDL Cholesterol Cal: 11 mg/dL (ref 5–40)

## 2023-07-12 LAB — HEPATIC FUNCTION PANEL
ALT: 26 [IU]/L (ref 0–44)
AST: 33 [IU]/L (ref 0–40)
Albumin: 4.1 g/dL (ref 3.9–4.9)
Alkaline Phosphatase: 92 [IU]/L (ref 44–121)
Bilirubin Total: 0.6 mg/dL (ref 0.0–1.2)
Bilirubin, Direct: 0.25 mg/dL (ref 0.00–0.40)
Total Protein: 7.1 g/dL (ref 6.0–8.5)

## 2023-07-12 NOTE — Assessment & Plan Note (Signed)
 Family history of heart disease with father who had his first MI at age 68.  His brother had his first MI at age 42 and bypass graft surgery at 72.

## 2023-07-12 NOTE — Patient Instructions (Addendum)
 Medication Instructions:  Your physician recommends that you continue on your current medications as directed. Please refer to the Current Medication list given to you today.  *If you need a refill on your cardiac medications before your next appointment, please call your pharmacy*   Lab Work: Your physician recommends that you have labs drawn today: Lipid/liver panel  If you have labs (blood work) drawn today and your tests are completely normal, you will receive your results only by: MyChart Message (if you have MyChart) OR A paper copy in the mail If you have any lab test that is abnormal or we need to change your treatment, we will call you to review the results.   Follow-Up: At [facility], you and your health needs are our priority.  As part of our continuing mission to provide you with exceptional heart care, we have created designated Provider Care Teams.  These Care Teams include your primary Cardiologist (physician) and Advanced Practice Providers (APPs -  Physician Assistants and Nurse Practitioners) who all work together to provide you with the care you need, when you need it.  We recommend signing up for the patient portal called "MyChart".  Sign up information is provided on this After Visit Summary.  MyChart is used to connect with patients for Virtual Visits (Telemedicine).  Patients are able to view lab/test results, encounter notes, upcoming appointments, etc.  Non-urgent messages can be sent to your provider as well.   To learn more about what you can do with MyChart, go to [email or web address].    Your next appointment:   12 month(s)  Provider:   Nanetta Batty, MD     Other Instructions

## 2023-07-12 NOTE — Progress Notes (Signed)
 07/12/2023 FRISCO CORDTS   1955/12/15  [id number]  Primary Physician Leila Lucie LABOR, MD Primary Cardiologist: Dorn JINNY Lesches MD GENI SIX, [place], FSCAI  HPI:  Kyle Hansen is a 68 y.o.   thin-appearing, married Caucasian male, father of 2, grandfather to 2 grandchildren who I last saw in the office 05/07/2021.Kyle Hansen He is referred to me because of positive risk factors. His wife Kyle Hansen is also a patient of mine, and is accompanying him today.  Apparently she was recently diagnosed with breast cancer and underwent surgery for this recently.  She is currently cancer free.  He has a history of hyperlipidemia as well as a strong family history for heart disease with a father that had his first MI at 61. His brother had his first MI at age 98 and bypass grafting at 53. He has never had a heart attack or stroke. He denies chest pain or shortness of breath. He had a Myoview stress test performed several years ago in our office which is normal and carotid Dopplers that showed no evidence of ICA stenosis.   He was admitted to [facility] with a CVA 11/02/2019 with some speech impediment which resolved spontaneously.  Also had some left hand tingling.  His work-up was essentially benign.  He was discharged the following day.  He was placed on Plavix .  His statin drug was increased to 40 mg of Crestor  daily.  A subsequent transesophageal echo performed by Dr. Barbaraann to evaluate embolic source on 11/20/2019 was unrevealing.  Event monitor showed no evidence of A. Fib.   Since I saw him in the office 2-1/2 years ago he is remained stable.  He works out in [employer] 5 days a week and rides a stationary bicycle 3 days a week for approximately 5 miles.  He is completely asymptomatic.   Current Meds  Medication Sig   ASPIRIN  81 PO Take by mouth.   Coenzyme Q10 (COQ10) 200 MG CAPS Take 200 mg by mouth daily.    Multiple Vitamin (MULTIVITAMIN) capsule Take 1 capsule by mouth daily.      rosuvastatin  (CRESTOR ) 20 MG tablet TAKE 1 TABLET BY MOUTH DAILY     Allergies  Allergen Reactions   Barbiturates Other (See Comments)    Can cause activity of porphyria   Chloramphenicols Other (See Comments)    Can induce porphyria   Griseofulvin Other (See Comments)    Can induce porphyria   Librium Other (See Comments)    Can induce porphyria   Methsuximide Other (See Comments)    Can induce porphyria   [place] [Meprobamate] Other (See Comments)    Can induce porphyria   Nicergoline Other (See Comments)    Can induce porphyria   Orinase [Tolbutamide] Other (See Comments)    Can induce porphyria   Sulfonamide Derivatives Other (See Comments)    Can induce porphyria   Tofranil-Pm Other (See Comments)    Can induce porphyria   Atorvastatin  Other (See Comments)    Muscle aches   Chlordiazepoxide    Doxycycline Other (See Comments)    sweats   Imipramine    Phenytoin    Phenytoin Sodium Extended Other (See Comments)    Can induce porphyria   Sulfa Antibiotics     Social History   Socioeconomic History   Marital status: Married    Spouse name: Attila Mccarthy   Number of children: 2   Years of education: Not on file   Highest education  level: Not on file  Occupational History   Occupation: Retired  Tobacco Use   Smoking status: Never   Smokeless tobacco: Current  Substance and Sexual Activity   Alcohol use: No   Drug use: No   Sexual activity: Not on file  Other Topics Concern   Not on file  Social History Narrative   Not on file   Social Drivers of Health   Financial Resource Strain: Not on file  Food Insecurity: Not on file  Transportation Needs: Not on file  Physical Activity: Not on file  Stress: Not on file  Social Connections: Not on file  Intimate Partner Violence: Not on file     Review of Systems: General: negative for chills, fever, night sweats or weight changes.  Cardiovascular: negative for chest pain, dyspnea on exertion, edema,  orthopnea, palpitations, paroxysmal nocturnal dyspnea or shortness of breath Dermatological: negative for rash Respiratory: negative for cough or wheezing Urologic: negative for hematuria Abdominal: negative for nausea, vomiting, diarrhea, bright red blood per rectum, melena, or hematemesis Neurologic: negative for visual changes, syncope, or dizziness All other systems reviewed and are otherwise negative except as noted above.    Blood pressure 124/80, pulse (!) 48, height 5' 8 (1.727 m), weight 175 lb (79.4 kg), SpO2 100%.  General appearance: alert and no distress Neck: no adenopathy, no carotid bruit, no JVD, supple, symmetrical, trachea midline, and thyroid not enlarged, symmetric, no tenderness/mass/nodules Lungs: clear to auscultation bilaterally Heart: regular rate and rhythm, S1, S2 normal, no murmur, click, rub or gallop Extremities: extremities normal, atraumatic, no cyanosis or edema Pulses: 2+ and symmetric Skin: Skin color, texture, turgor normal. No rashes or lesions Neurologic: Grossly normal  EKG not performed today      ASSESSMENT AND PLAN:   Family history of premature CAD Family history of heart disease with father who had his first MI at age 28.  His brother had his first MI at age 41 and bypass graft surgery at 77.  Hyperlipidemia with target LDL less than 70 History of hyperlipidemia on Crestor  and Zetia  with lipid profile performed 11/11/2022 revealing total cholesterol 143, LDL 64 and HDL of 66.     Dorn DOROTHA Lesches MD [facility], [facility] 07/12/2023 9:40 AM

## 2023-07-12 NOTE — Assessment & Plan Note (Signed)
 History of hyperlipidemia on Crestor and Zetia with lipid profile performed 11/11/2022 revealing total cholesterol 143, LDL 64 and HDL of 66.

## 2023-08-05 ENCOUNTER — Other Ambulatory Visit: Payer: Self-pay | Admitting: Orthopedic Surgery

## 2023-08-05 DIAGNOSIS — M79602 Pain in left arm: Secondary | ICD-10-CM

## 2023-08-06 ENCOUNTER — Ambulatory Visit
Admission: RE | Admit: 2023-08-06 | Discharge: 2023-08-06 | Disposition: A | Discharge status: Home / Self Care | Payer: Medicare Other | Source: Ambulatory Visit | Attending: Orthopedic Surgery | Admitting: Orthopedic Surgery

## 2023-08-06 DIAGNOSIS — M79602 Pain in left arm: Secondary | ICD-10-CM

## 2023-09-21 ENCOUNTER — Other Ambulatory Visit: Payer: Self-pay | Admitting: Student

## 2023-09-21 DIAGNOSIS — E785 Hyperlipidemia, unspecified: Secondary | ICD-10-CM

## 2023-11-23 ENCOUNTER — Other Ambulatory Visit: Payer: Self-pay

## 2023-11-23 MED ORDER — ROSUVASTATIN CALCIUM 20 MG PO TABS: 20.0000 mg | ORAL_TABLET | Freq: Every day | ORAL | 2 refills | Status: AC

## 2023-12-13 ENCOUNTER — Telehealth: Payer: Self-pay | Admitting: Cardiovascular Disease

## 2023-12-13 NOTE — Telephone Encounter (Signed)
*  STAT* If patient is at the pharmacy, call can be transferred to refill team.   1. Which medications need to be refilled? (please list name of each medication and dose if known)   ezetimibe  (ZETIA ) 10 MG tablet   2. Which pharmacy/location (including street and city if local pharmacy) is medication to be sent to? [facility] [place], [place] - [address] [place] Phone: [phone]  Fax: [phone]     3. Do they need a 30 day or 90 day supply? 90

## 2024-07-05 ENCOUNTER — Encounter: Payer: Self-pay | Admitting: Cardiovascular Disease

## 2024-07-25 ENCOUNTER — Ambulatory Visit: Admitting: Cardiovascular Disease

## 2024-07-26 ENCOUNTER — Encounter: Payer: Self-pay | Admitting: Cardiovascular Disease

## 2024-07-26 ENCOUNTER — Ambulatory Visit: Admitting: Cardiovascular Disease

## 2024-07-26 VITALS — BP 126/74 | HR 50 | Ht 68.0 in | Wt 178.2 lb

## 2024-07-26 DIAGNOSIS — E785 Hyperlipidemia, unspecified: Secondary | ICD-10-CM | POA: Diagnosis not present

## 2024-07-26 DIAGNOSIS — Z8249 Family history of ischemic heart disease and other diseases of the circulatory system: Secondary | ICD-10-CM

## 2024-07-26 DIAGNOSIS — I779 Disorder of arteries and arterioles, unspecified: Secondary | ICD-10-CM | POA: Diagnosis not present

## 2024-07-26 NOTE — Progress Notes (Signed)
 "     07/26/2024 Kyle Hansen   01-Jan-1956  [id number]  Primary Physician Jesus Elberta Gainer, FNP Primary Cardiologist: Dorn JINNY Lesches MD FACP, [place], [place], FSCAI  HPI:  Kyle Hansen is a 69 y.o.    thin-appearing, married Caucasian male, father of 2, grandfather to 2 grandchildren who I last saw in the office 07/02/2023.[name] He is referred to me because of positive risk factors. His wife [name] is also a patient of mine who unfortunately had a block last year and caring a significant amount of damage although she was unhurt.  Apparently she was recently diagnosed with breast cancer and underwent surgery for this recently.  She is currently cancer free.  He has a history of hyperlipidemia as well as a strong family history for heart disease with a father that had his first MI at 18. His brother had his first MI at age 35 and bypass grafting at 59. He has never had a heart attack or stroke. He denies chest pain or shortness of breath. He had a Myoview stress test performed several years ago in our office which is normal and carotid Dopplers that showed no evidence of ICA stenosis.   He was admitted to [facility] with a CVA 11/02/2019 with some speech impediment which resolved spontaneously.  Also had some left hand tingling.  His work-up was essentially benign.  He was discharged the following day.  He was placed on Plavix .  His statin drug was increased to 40 mg of Crestor  daily.  A subsequent transesophageal echo performed by Dr. Barbaraann to evaluate embolic source on 11/20/2019 was unrevealing.  Event monitor showed no evidence of A. Fib.   Since I saw him in the office 1 year ago he continues to do well.  He did have some dental trauma however.  He denies chest pain or shortness of breath.  He still remains active working out at the gym 5 days a week and riding a stationary bicycle.     Active Medications[1]   Allergies[2]  Social History   Socioeconomic History   Marital  status: Married    Spouse name: [name]   Number of children: 2   Years of education: Not on file   Highest education level: Not on file  Occupational History   Occupation: Retired  Tobacco Use   Smoking status: Never   Smokeless tobacco: Current  Substance and Sexual Activity   Alcohol use: No   Drug use: No   Sexual activity: Not on file  Other Topics Concern   Not on file  Social History Narrative   Not on file   Social Drivers of Health   Tobacco Use: High Risk (07/26/2024)   Patient History    Smoking Tobacco Use: Never    Smokeless Tobacco Use: Current    Passive Exposure: Not on file  Financial Resource Strain: Not on file  Food Insecurity: Not on file  Transportation Needs: Not on file  Physical Activity: Not on file  Stress: Not on file  Social Connections: Not on file  Intimate Partner Violence: Not on file  Depression (EYV7-0): Not on file  Alcohol Screen: Not on file  Housing: Not on file  Utilities: Not on file  Health Literacy: Not on file     Review of Systems: General: negative for chills, fever, night sweats or weight changes.  Cardiovascular: negative for chest pain, dyspnea on exertion, edema, orthopnea, palpitations, paroxysmal nocturnal dyspnea or shortness of breath Dermatological:  negative for rash Respiratory: negative for cough or wheezing Urologic: negative for hematuria Abdominal: negative for nausea, vomiting, diarrhea, bright red blood per rectum, melena, or hematemesis Neurologic: negative for visual changes, syncope, or dizziness All other systems reviewed and are otherwise negative except as noted above.    Blood pressure 126/74, pulse (!) 50, height 5' 8 (1.727 m), weight 178 lb 3.2 oz (80.8 kg), SpO2 97%.  General appearance: alert and no distress Neck: no adenopathy, no carotid bruit, no JVD, supple, symmetrical, trachea midline, and thyroid not enlarged, symmetric, no tenderness/mass/nodules Lungs: clear to  auscultation bilaterally Heart: regular rate and rhythm, S1, S2 normal, no murmur, click, rub or gallop Extremities: extremities normal, atraumatic, no cyanosis or edema Pulses: 2+ and symmetric Skin: Skin color, texture, turgor normal. No rashes or lesions   EKG EKG Interpretation Date/Time:  Wednesday July 26 2024 09:50:33 EST Ventricular Rate:  50 PR Interval:  148 QRS Duration:  92 QT Interval:  432 QTC Calculation: 393 R Axis:   48  Text Interpretation: Sinus bradycardia When compared with ECG of 05-Feb-2023 09:12, No significant change was found Confirmed by Court Carrier [phone]) on 07/26/2024 10:19:46 AM    ASSESSMENT AND PLAN:   Hyperlipidemia with target LDL less than 70 History of hyperlipidemia on Zetia  and rosuvastatin  with lipid profile performed 07/12/2023 revealing total cholesterol 156, LDL 72 HDL 73.     Carrier DOROTHA Court MD FACP,FACC,FAHA, [facility] 07/26/2024 10:26 AM    [1]  Current Meds  Medication Sig   ASPIRIN  81 PO Take by mouth.   Coenzyme Q10 (COQ10) 200 MG CAPS Take 200 mg by mouth daily.    ezetimibe  (ZETIA ) 10 MG tablet TAKE 1 TABLET BY MOUTH DAILY   Multiple Vitamin (MULTIVITAMIN) capsule Take 1 capsule by mouth daily.     rosuvastatin  (CRESTOR ) 20 MG tablet Take 1 tablet (20 mg total) by mouth daily.  [2]  Allergies Allergen Reactions   Barbiturates Other (See Comments)    Can cause activity of porphyria   Chloramphenicol And Thiamphenicol Other (See Comments)    Can induce porphyria   Griseofulvin Other (See Comments)    Can induce porphyria   Librium Other (See Comments)    Can induce porphyria   Methsuximide Other (See Comments)    Can induce porphyria   [place] [Meprobamate] Other (See Comments)    Can induce porphyria   Nicergoline Other (See Comments)    Can induce porphyria   Orinase [Tolbutamide] Other (See Comments)    Can induce porphyria   Sulfonamide Derivatives Other (See Comments)    Can induce porphyria    Tofranil-Pm Other (See Comments)    Can induce porphyria   Atorvastatin  Other (See Comments)    Muscle aches   Chlordiazepoxide    Doxycycline Other (See Comments)    sweats   Imipramine    Phenytoin    Phenytoin Sodium Extended Other (See Comments)    Can induce porphyria   Sulfa Antibiotics    "

## 2024-07-26 NOTE — Assessment & Plan Note (Signed)
 History of hyperlipidemia on Zetia  and rosuvastatin  with lipid profile performed 07/12/2023 revealing total cholesterol 156, LDL 72 HDL 73.

## 2024-07-26 NOTE — Patient Instructions (Signed)
